# Patient Record
Sex: Female | Born: 1965 | Race: Black or African American | Hispanic: No | Smoking: Former smoker
Health system: Southern US, Community
[De-identification: ages and names within clinical notes are randomized; demographics above are authoritative.]

## PROBLEM LIST (undated history)

## (undated) DIAGNOSIS — K5900 Constipation, unspecified: Secondary | ICD-10-CM

## (undated) DIAGNOSIS — Z91018 Allergy to other foods: Secondary | ICD-10-CM

## (undated) DIAGNOSIS — M7989 Other specified soft tissue disorders: Secondary | ICD-10-CM

## (undated) DIAGNOSIS — E559 Vitamin D deficiency, unspecified: Secondary | ICD-10-CM

## (undated) DIAGNOSIS — E7800 Pure hypercholesterolemia, unspecified: Secondary | ICD-10-CM

## (undated) DIAGNOSIS — R079 Chest pain, unspecified: Secondary | ICD-10-CM

## (undated) DIAGNOSIS — E538 Deficiency of other specified B group vitamins: Secondary | ICD-10-CM

## (undated) DIAGNOSIS — R12 Heartburn: Secondary | ICD-10-CM

## (undated) DIAGNOSIS — M2550 Pain in unspecified joint: Secondary | ICD-10-CM

## (undated) DIAGNOSIS — R0602 Shortness of breath: Secondary | ICD-10-CM

## (undated) DIAGNOSIS — M549 Dorsalgia, unspecified: Secondary | ICD-10-CM

## (undated) DIAGNOSIS — E0500 Thyrotoxicosis with diffuse goiter without thyrotoxic crisis or storm: Secondary | ICD-10-CM

## (undated) DIAGNOSIS — E0590 Thyrotoxicosis, unspecified without thyrotoxic crisis or storm: Secondary | ICD-10-CM

## (undated) DIAGNOSIS — I82409 Acute embolism and thrombosis of unspecified deep veins of unspecified lower extremity: Secondary | ICD-10-CM

## (undated) HISTORY — DX: Allergy to other foods: Z91.018

## (undated) HISTORY — DX: Constipation, unspecified: K59.00

## (undated) HISTORY — DX: Other specified soft tissue disorders: M79.89

## (undated) HISTORY — DX: Thyrotoxicosis, unspecified without thyrotoxic crisis or storm: E05.90

## (undated) HISTORY — DX: Heartburn: R12

## (undated) HISTORY — DX: Vitamin D deficiency, unspecified: E55.9

## (undated) HISTORY — DX: Dorsalgia, unspecified: M54.9

## (undated) HISTORY — DX: Deficiency of other specified B group vitamins: E53.8

## (undated) HISTORY — DX: Chest pain, unspecified: R07.9

## (undated) HISTORY — PX: ABDOMINAL HYSTERECTOMY: SHX81

## (undated) HISTORY — DX: Shortness of breath: R06.02

## (undated) HISTORY — DX: Thyrotoxicosis with diffuse goiter without thyrotoxic crisis or storm: E05.00

## (undated) HISTORY — DX: Pain in unspecified joint: M25.50

---

## 1998-08-15 ENCOUNTER — Emergency Department (HOSPITAL_COMMUNITY): Admission: EM | Admit: 1998-08-15 | Discharge: 1998-08-15 | Payer: Self-pay | Admitting: Emergency Medicine

## 1998-08-15 ENCOUNTER — Encounter: Payer: Self-pay | Admitting: Emergency Medicine

## 2006-05-07 ENCOUNTER — Other Ambulatory Visit: Admission: RE | Admit: 2006-05-07 | Discharge: 2006-05-07 | Payer: Self-pay | Admitting: Obstetrics and Gynecology

## 2008-06-22 ENCOUNTER — Ambulatory Visit (HOSPITAL_COMMUNITY): Admission: RE | Admit: 2008-06-22 | Discharge: 2008-06-22 | Payer: Self-pay | Admitting: Obstetrics and Gynecology

## 2008-07-02 ENCOUNTER — Encounter (INDEPENDENT_AMBULATORY_CARE_PROVIDER_SITE_OTHER): Payer: Self-pay | Admitting: Obstetrics and Gynecology

## 2008-07-02 ENCOUNTER — Ambulatory Visit (HOSPITAL_COMMUNITY): Admission: RE | Admit: 2008-07-02 | Discharge: 2008-07-02 | Payer: Self-pay | Admitting: Obstetrics and Gynecology

## 2008-07-02 ENCOUNTER — Inpatient Hospital Stay (HOSPITAL_COMMUNITY): Admission: AD | Admit: 2008-07-02 | Discharge: 2008-07-02 | Payer: Self-pay | Admitting: Obstetrics and Gynecology

## 2008-07-02 ENCOUNTER — Ambulatory Visit: Payer: Self-pay | Admitting: Vascular Surgery

## 2008-07-04 ENCOUNTER — Ambulatory Visit: Payer: Self-pay | Admitting: Internal Medicine

## 2008-07-04 ENCOUNTER — Encounter (INDEPENDENT_AMBULATORY_CARE_PROVIDER_SITE_OTHER): Payer: Self-pay | Admitting: Obstetrics and Gynecology

## 2008-07-04 ENCOUNTER — Inpatient Hospital Stay (HOSPITAL_COMMUNITY): Admission: AD | Admit: 2008-07-04 | Discharge: 2008-07-06 | Payer: Self-pay | Admitting: Obstetrics and Gynecology

## 2008-07-09 ENCOUNTER — Ambulatory Visit: Payer: Self-pay | Admitting: Internal Medicine

## 2008-07-11 LAB — CBC WITH DIFFERENTIAL/PLATELET
BASO%: 1.2 % (ref 0.0–2.0)
Basophils Absolute: 0.1 10*3/uL (ref 0.0–0.1)
EOS%: 2.7 % (ref 0.0–7.0)
Eosinophils Absolute: 0.2 10*3/uL (ref 0.0–0.5)
HCT: 28.9 % — ABNORMAL LOW (ref 34.8–46.6)
HGB: 9 g/dL — ABNORMAL LOW (ref 11.6–15.9)
LYMPH%: 41.5 % (ref 14.0–48.0)
MCH: 21.6 pg — ABNORMAL LOW (ref 26.0–34.0)
MCHC: 31.1 g/dL — ABNORMAL LOW (ref 32.0–36.0)
MCV: 69.4 fL — ABNORMAL LOW (ref 81.0–101.0)
MONO#: 0.5 10*3/uL (ref 0.1–0.9)
MONO%: 7.5 % (ref 0.0–13.0)
NEUT#: 2.9 10*3/uL (ref 1.5–6.5)
NEUT%: 47.1 % (ref 39.6–76.8)
Platelets: 431 10*3/uL — ABNORMAL HIGH (ref 145–400)
RBC: 4.16 10*6/uL (ref 3.70–5.32)
RDW: 19 % — ABNORMAL HIGH (ref 11.3–14.5)
WBC: 6.1 10*3/uL (ref 3.9–10.0)
lymph#: 2.5 10*3/uL (ref 0.9–3.3)

## 2008-07-11 LAB — HEPARIN ANTI-XA: Heparin LMW: 1.38 [IU]/mL

## 2008-07-16 LAB — PROTIME-INR
INR: 1.2 — ABNORMAL LOW (ref 2.00–3.50)
Protime: 14.4 Seconds — ABNORMAL HIGH (ref 10.6–13.4)

## 2008-07-18 LAB — PROTIME-INR
INR: 1.4 — ABNORMAL LOW (ref 2.00–3.50)
Protime: 16.8 Seconds — ABNORMAL HIGH (ref 10.6–13.4)

## 2008-07-24 LAB — PROTIME-INR
INR: 1.9 — ABNORMAL LOW (ref 2.00–3.50)
Protime: 22.8 Seconds — ABNORMAL HIGH (ref 10.6–13.4)

## 2008-07-30 LAB — CBC WITH DIFFERENTIAL/PLATELET
BASO%: 1.1 % (ref 0.0–2.0)
Basophils Absolute: 0.1 10*3/uL (ref 0.0–0.1)
EOS%: 3.5 % (ref 0.0–7.0)
Eosinophils Absolute: 0.2 10*3/uL (ref 0.0–0.5)
HCT: 35.5 % (ref 34.8–46.6)
HGB: 11 g/dL — ABNORMAL LOW (ref 11.6–15.9)
LYMPH%: 38.9 % (ref 14.0–48.0)
MCH: 22.4 pg — ABNORMAL LOW (ref 26.0–34.0)
MCHC: 31 g/dL — ABNORMAL LOW (ref 32.0–36.0)
MCV: 72.4 fL — ABNORMAL LOW (ref 81.0–101.0)
MONO#: 0.4 10*3/uL (ref 0.1–0.9)
MONO%: 7.8 % (ref 0.0–13.0)
NEUT#: 2.8 10*3/uL (ref 1.5–6.5)
NEUT%: 48.7 % (ref 39.6–76.8)
Platelets: 239 10*3/uL (ref 145–400)
RBC: 4.91 10*6/uL (ref 3.70–5.32)
RDW: 23.4 % — ABNORMAL HIGH (ref 11.3–14.5)
WBC: 5.7 10*3/uL (ref 3.9–10.0)
lymph#: 2.2 10*3/uL (ref 0.9–3.3)

## 2008-07-30 LAB — PROTIME-INR
INR: 1.2 — ABNORMAL LOW (ref 2.00–3.50)
Protime: 14.4 Seconds — ABNORMAL HIGH (ref 10.6–13.4)

## 2008-08-02 LAB — CBC WITH DIFFERENTIAL/PLATELET
BASO%: 1.5 % (ref 0.0–2.0)
Basophils Absolute: 0.1 10*3/uL (ref 0.0–0.1)
EOS%: 3.8 % (ref 0.0–7.0)
Eosinophils Absolute: 0.2 10*3/uL (ref 0.0–0.5)
HCT: 38.1 % (ref 34.8–46.6)
HGB: 11.8 g/dL (ref 11.6–15.9)
LYMPH%: 35.9 % (ref 14.0–48.0)
MCH: 22.7 pg — ABNORMAL LOW (ref 26.0–34.0)
MCHC: 30.9 g/dL — ABNORMAL LOW (ref 32.0–36.0)
MCV: 73.4 fL — ABNORMAL LOW (ref 81.0–101.0)
MONO#: 0.3 10*3/uL (ref 0.1–0.9)
MONO%: 5.9 % (ref 0.0–13.0)
NEUT#: 2.5 10*3/uL (ref 1.5–6.5)
NEUT%: 52.9 % (ref 39.6–76.8)
Platelets: 246 10*3/uL (ref 145–400)
RBC: 5.19 10*6/uL (ref 3.70–5.32)
RDW: 23.1 % — ABNORMAL HIGH (ref 11.3–14.5)
WBC: 4.8 10*3/uL (ref 3.9–10.0)
lymph#: 1.7 10*3/uL (ref 0.9–3.3)

## 2008-08-02 LAB — PROTIME-INR
INR: 2.2 (ref 2.00–3.50)
Protime: 26.4 Seconds — ABNORMAL HIGH (ref 10.6–13.4)

## 2008-08-06 LAB — PROTIME-INR
INR: 3 (ref 2.00–3.50)
Protime: 36 Seconds — ABNORMAL HIGH (ref 10.6–13.4)

## 2008-08-15 LAB — CBC WITH DIFFERENTIAL/PLATELET
BASO%: 1.5 % (ref 0.0–2.0)
Basophils Absolute: 0.1 10*3/uL (ref 0.0–0.1)
EOS%: 2.7 % (ref 0.0–7.0)
Eosinophils Absolute: 0.2 10*3/uL (ref 0.0–0.5)
HCT: 36.6 % (ref 34.8–46.6)
HGB: 11.6 g/dL (ref 11.6–15.9)
LYMPH%: 31.4 % (ref 14.0–48.0)
MCH: 23.6 pg — ABNORMAL LOW (ref 26.0–34.0)
MCHC: 31.5 g/dL — ABNORMAL LOW (ref 32.0–36.0)
MCV: 74.7 fL — ABNORMAL LOW (ref 81.0–101.0)
MONO#: 0.5 10*3/uL (ref 0.1–0.9)
MONO%: 7.3 % (ref 0.0–13.0)
NEUT#: 3.6 10*3/uL (ref 1.5–6.5)
NEUT%: 57.1 % (ref 39.6–76.8)
Platelets: 305 10*3/uL (ref 145–400)
RBC: 4.9 10*6/uL (ref 3.70–5.32)
RDW: 22.9 % — ABNORMAL HIGH (ref 11.3–14.5)
WBC: 6.3 10*3/uL (ref 3.9–10.0)
lymph#: 2 10*3/uL (ref 0.9–3.3)

## 2008-08-15 LAB — PROTIME-INR
INR: 3 (ref 2.00–3.50)
Protime: 36 Seconds — ABNORMAL HIGH (ref 10.6–13.4)

## 2008-08-27 ENCOUNTER — Ambulatory Visit: Payer: Self-pay | Admitting: Internal Medicine

## 2008-08-27 LAB — CBC WITH DIFFERENTIAL/PLATELET
BASO%: 1.2 % (ref 0.0–2.0)
Basophils Absolute: 0.1 10*3/uL (ref 0.0–0.1)
EOS%: 3.7 % (ref 0.0–7.0)
Eosinophils Absolute: 0.2 10*3/uL (ref 0.0–0.5)
HCT: 38.1 % (ref 34.8–46.6)
HGB: 12.2 g/dL (ref 11.6–15.9)
LYMPH%: 34.7 % (ref 14.0–48.0)
MCH: 24.5 pg — ABNORMAL LOW (ref 26.0–34.0)
MCHC: 32.1 g/dL (ref 32.0–36.0)
MCV: 76.4 fL — ABNORMAL LOW (ref 81.0–101.0)
MONO#: 0.6 10*3/uL (ref 0.1–0.9)
MONO%: 11.7 % (ref 0.0–13.0)
NEUT#: 2.6 10*3/uL (ref 1.5–6.5)
NEUT%: 48.7 % (ref 39.6–76.8)
Platelets: 230 10*3/uL (ref 145–400)
RBC: 5 10*6/uL (ref 3.70–5.32)
RDW: 22.3 % — ABNORMAL HIGH (ref 11.3–14.5)
WBC: 5.4 10*3/uL (ref 3.9–10.0)
lymph#: 1.9 10*3/uL (ref 0.9–3.3)

## 2008-08-27 LAB — PROTIME-INR
INR: 2.8 (ref 2.00–3.50)
Protime: 33.6 Seconds — ABNORMAL HIGH (ref 10.6–13.4)

## 2008-09-10 LAB — CBC WITH DIFFERENTIAL/PLATELET
BASO%: 1.5 % (ref 0.0–2.0)
Basophils Absolute: 0.1 10*3/uL (ref 0.0–0.1)
EOS%: 3.1 % (ref 0.0–7.0)
Eosinophils Absolute: 0.2 10*3/uL (ref 0.0–0.5)
HCT: 36.7 % (ref 34.8–46.6)
HGB: 12 g/dL (ref 11.6–15.9)
LYMPH%: 28.6 % (ref 14.0–48.0)
MCH: 25.1 pg — ABNORMAL LOW (ref 26.0–34.0)
MCHC: 32.6 g/dL (ref 32.0–36.0)
MCV: 77 fL — ABNORMAL LOW (ref 81.0–101.0)
MONO#: 0.4 10*3/uL (ref 0.1–0.9)
MONO%: 8.7 % (ref 0.0–13.0)
NEUT#: 3 10*3/uL (ref 1.5–6.5)
NEUT%: 58.1 % (ref 39.6–76.8)
Platelets: 294 10*3/uL (ref 145–400)
RBC: 4.77 10*6/uL (ref 3.70–5.32)
RDW: 20.6 % — ABNORMAL HIGH (ref 11.3–14.5)
WBC: 5.1 10*3/uL (ref 3.9–10.0)
lymph#: 1.5 10*3/uL (ref 0.9–3.3)

## 2008-09-10 LAB — PROTIME-INR
INR: 1.4 — ABNORMAL LOW (ref 2.00–3.50)
Protime: 16.8 Seconds — ABNORMAL HIGH (ref 10.6–13.4)

## 2008-09-13 LAB — PROTIME-INR
INR: 1.8 — ABNORMAL LOW (ref 2.00–3.50)
Protime: 21.6 Seconds — ABNORMAL HIGH (ref 10.6–13.4)

## 2008-09-27 LAB — CBC WITH DIFFERENTIAL/PLATELET
BASO%: 1.7 % (ref 0.0–2.0)
Basophils Absolute: 0.1 10*3/uL (ref 0.0–0.1)
EOS%: 5.7 % (ref 0.0–7.0)
Eosinophils Absolute: 0.2 10*3/uL (ref 0.0–0.5)
HCT: 35.7 % (ref 34.8–46.6)
HGB: 11.5 g/dL — ABNORMAL LOW (ref 11.6–15.9)
LYMPH%: 33.1 % (ref 14.0–48.0)
MCH: 25.9 pg — ABNORMAL LOW (ref 26.0–34.0)
MCHC: 32.2 g/dL (ref 32.0–36.0)
MCV: 80.6 fL — ABNORMAL LOW (ref 81.0–101.0)
MONO#: 0.3 10*3/uL (ref 0.1–0.9)
MONO%: 9 % (ref 0.0–13.0)
NEUT#: 2 10*3/uL (ref 1.5–6.5)
NEUT%: 50.6 % (ref 39.6–76.8)
Platelets: 240 10*3/uL (ref 145–400)
RBC: 4.44 10*6/uL (ref 3.70–5.32)
RDW: 19.9 % — ABNORMAL HIGH (ref 11.3–14.5)
WBC: 3.9 10*3/uL (ref 3.9–10.0)
lymph#: 1.3 10*3/uL (ref 0.9–3.3)

## 2008-09-27 LAB — PROTIME-INR
INR: 1.8 — ABNORMAL LOW (ref 2.00–3.50)
Protime: 21.6 Seconds — ABNORMAL HIGH (ref 10.6–13.4)

## 2008-10-09 ENCOUNTER — Ambulatory Visit: Payer: Self-pay | Admitting: Surgery

## 2008-10-09 ENCOUNTER — Ambulatory Visit (HOSPITAL_COMMUNITY): Admission: RE | Admit: 2008-10-09 | Discharge: 2008-10-09 | Payer: Self-pay | Admitting: Obstetrics and Gynecology

## 2008-10-09 ENCOUNTER — Encounter (INDEPENDENT_AMBULATORY_CARE_PROVIDER_SITE_OTHER): Payer: Self-pay | Admitting: Obstetrics and Gynecology

## 2008-10-15 ENCOUNTER — Inpatient Hospital Stay (HOSPITAL_COMMUNITY): Admission: RE | Admit: 2008-10-15 | Discharge: 2008-10-17 | Payer: Self-pay | Admitting: Obstetrics and Gynecology

## 2008-10-15 ENCOUNTER — Encounter (INDEPENDENT_AMBULATORY_CARE_PROVIDER_SITE_OTHER): Payer: Self-pay | Admitting: Obstetrics and Gynecology

## 2008-10-19 ENCOUNTER — Ambulatory Visit: Payer: Self-pay | Admitting: Internal Medicine

## 2008-10-23 LAB — PROTIME-INR
INR: 1.5 — ABNORMAL LOW (ref 2.00–3.50)
Protime: 18 Seconds — ABNORMAL HIGH (ref 10.6–13.4)

## 2008-10-26 LAB — CBC WITH DIFFERENTIAL/PLATELET
BASO%: 1 % (ref 0.0–2.0)
Basophils Absolute: 0.1 10*3/uL (ref 0.0–0.1)
EOS%: 3.2 % (ref 0.0–7.0)
Eosinophils Absolute: 0.2 10*3/uL (ref 0.0–0.5)
HCT: 31.5 % — ABNORMAL LOW (ref 34.8–46.6)
HGB: 10.6 g/dL — ABNORMAL LOW (ref 11.6–15.9)
LYMPH%: 32.4 % (ref 14.0–48.0)
MCH: 28.2 pg (ref 26.0–34.0)
MCHC: 33.8 g/dL (ref 32.0–36.0)
MCV: 83.5 fL (ref 81.0–101.0)
MONO#: 0.4 10*3/uL (ref 0.1–0.9)
MONO%: 7.7 % (ref 0.0–13.0)
NEUT#: 3.1 10*3/uL (ref 1.5–6.5)
NEUT%: 55.7 % (ref 39.6–76.8)
Platelets: 432 10*3/uL — ABNORMAL HIGH (ref 145–400)
RBC: 3.77 10*6/uL (ref 3.70–5.32)
RDW: 16 % — ABNORMAL HIGH (ref 11.3–14.5)
WBC: 5.6 10*3/uL (ref 3.9–10.0)
lymph#: 1.8 10*3/uL (ref 0.9–3.3)

## 2008-10-26 LAB — PROTIME-INR
INR: 1.4 — ABNORMAL LOW (ref 2.00–3.50)
Protime: 16.8 Seconds — ABNORMAL HIGH (ref 10.6–13.4)

## 2008-10-30 LAB — PROTIME-INR
INR: 1.8 — ABNORMAL LOW (ref 2.00–3.50)
Protime: 21.6 Seconds — ABNORMAL HIGH (ref 10.6–13.4)

## 2008-11-02 LAB — PROTIME-INR
INR: 3.3 (ref 2.00–3.50)
Protime: 39.6 Seconds — ABNORMAL HIGH (ref 10.6–13.4)

## 2008-11-06 LAB — PROTIME-INR
INR: 2.4 (ref 2.00–3.50)
Protime: 28.8 Seconds — ABNORMAL HIGH (ref 10.6–13.4)

## 2008-11-23 LAB — PROTIME-INR
INR: 2 (ref 2.00–3.50)
Protime: 24 Seconds — ABNORMAL HIGH (ref 10.6–13.4)

## 2008-12-12 ENCOUNTER — Ambulatory Visit: Payer: Self-pay | Admitting: Internal Medicine

## 2008-12-14 LAB — CBC WITH DIFFERENTIAL/PLATELET
BASO%: 0.9 % (ref 0.0–2.0)
Basophils Absolute: 0 10*3/uL (ref 0.0–0.1)
EOS%: 5 % (ref 0.0–7.0)
Eosinophils Absolute: 0.3 10*3/uL (ref 0.0–0.5)
HCT: 37.8 % (ref 34.8–46.6)
HGB: 12.6 g/dL (ref 11.6–15.9)
LYMPH%: 26.9 % (ref 14.0–48.0)
MCH: 29.3 pg (ref 26.0–34.0)
MCHC: 33.4 g/dL (ref 32.0–36.0)
MCV: 87.9 fL (ref 81.0–101.0)
MONO#: 0.3 10*3/uL (ref 0.1–0.9)
MONO%: 5 % (ref 0.0–13.0)
NEUT#: 3.5 10*3/uL (ref 1.5–6.5)
NEUT%: 62.2 % (ref 39.6–76.8)
Platelets: 218 10*3/uL (ref 145–400)
RBC: 4.3 10*6/uL (ref 3.70–5.32)
RDW: 12.5 % (ref 11.3–14.5)
WBC: 5.6 10*3/uL (ref 3.9–10.0)
lymph#: 1.5 10*3/uL (ref 0.9–3.3)

## 2008-12-14 LAB — PROTIME-INR
INR: 1.1 — ABNORMAL LOW (ref 2.00–3.50)
Protime: 13.2 Seconds (ref 10.6–13.4)

## 2008-12-28 LAB — PROTIME-INR
INR: 1.9 — ABNORMAL LOW (ref 2.00–3.50)
Protime: 22.8 Seconds — ABNORMAL HIGH (ref 10.6–13.4)

## 2009-01-21 ENCOUNTER — Ambulatory Visit: Payer: Self-pay | Admitting: Internal Medicine

## 2009-01-23 LAB — CBC WITH DIFFERENTIAL/PLATELET
BASO%: 0.7 % (ref 0.0–2.0)
Basophils Absolute: 0 10*3/uL (ref 0.0–0.1)
EOS%: 3.1 % (ref 0.0–7.0)
Eosinophils Absolute: 0.1 10*3/uL (ref 0.0–0.5)
HCT: 40.2 % (ref 34.8–46.6)
HGB: 13.4 g/dL (ref 11.6–15.9)
LYMPH%: 37.2 % (ref 14.0–49.7)
MCH: 28 pg (ref 25.1–34.0)
MCHC: 33.3 g/dL (ref 31.5–36.0)
MCV: 84.1 fL (ref 79.5–101.0)
MONO#: 0.2 10*3/uL (ref 0.1–0.9)
MONO%: 5.3 % (ref 0.0–14.0)
NEUT#: 2.4 10*3/uL (ref 1.5–6.5)
NEUT%: 53.7 % (ref 38.4–76.8)
Platelets: 224 10*3/uL (ref 145–400)
RBC: 4.78 10*6/uL (ref 3.70–5.45)
RDW: 12 % (ref 11.2–14.5)
WBC: 4.5 10*3/uL (ref 3.9–10.3)
lymph#: 1.7 10*3/uL (ref 0.9–3.3)
nRBC: 0 % (ref 0–0)

## 2009-01-23 LAB — PROTIME-INR
INR: 2.2 (ref 2.00–3.50)
Protime: 26.4 Seconds — ABNORMAL HIGH (ref 10.6–13.4)

## 2009-07-03 ENCOUNTER — Encounter: Admission: RE | Admit: 2009-07-03 | Discharge: 2009-07-03 | Payer: Self-pay | Admitting: Obstetrics and Gynecology

## 2011-03-31 NOTE — H&P (Signed)
NAME:  Sandra Thornton, Sandra Thornton NO.:  [id number]   MEDICAL RECORD NO.:  [id number]         PATIENT TYPE:  WINP   LOCATION:                                FACILITY:  [facility]   PHYSICIAN:  Osborn Coho, M.D.   DATE OF BIRTH:  [birth year]   DATE OF ADMISSION:  DATE OF DISCHARGE:                              HISTORY & PHYSICAL   HISTORY OF PRESENT ILLNESS:  Sandra Thornton is a 45 year old single African  American female gravida 0 presenting for a total laparoscopic  hysterectomy because of symptomatic uterine fibroids, menorrhagia, and  dysmenorrhea.  Over the past year, the patient reports that her  menstrual flow has increased significantly, lasting approximately 8 days  during which time she changes a pad along with a tampon every 45  minutes.  The patient has often soiled her clothes and endures nausea  with cramps, which she rates as an 8/10 on a 10-point pain scale.  The  patient finds minimal relief with ibuprofen, which would decrease her  pain to a 5/10 on 10-point pain scale.  However, she still endures  significant lower back and pelvic cramping.  In April 2009, the patient  underwent a pelvic ultrasound, which showed a uterus measuring 7.35 x  9.85 x 7.11 cm along with four measurable uterine fibroids; anteriorly  3.47 x 2.35 x 2.93 cm; fundal 2.78 x 2.65 x 2.66 cm; posteriorly 4.05 x  3.39 x 3.59 cm, and posterior left pedunculated 4.23 x 3.97 x 4.0 cm.  The patient's endometrium was difficult to evaluate because of these  numerous fibroids.  The patient's ovaries on this day were within normal  limits.  A subsequent sonohystogram was done in May 2009, revealing  hyperechoic masses; posteriorly 1.09 x 0.56 cm and 0.96 x 0.56 cm;  anteriorly 1.51 x 0.88 cm, all of which have the appearance of polyps.  There was a fundal fibroid observed, which had a submucosal appearance  measuring 2.73 x 1.48 cm within the endometrial cavity.  In August 2009,  the patient underwent  hysteroscopy with D&C and was given an injection  of 3.75 mg of Lupron Depot.  Following this procedure, the patient  developed a left lower extremity deep vein thrombosis approximately 10  days postoperatively and was placed on anticoagulation therapy.  The  patient was also observed to be anemic at that time with a hemoglobin of  9.4, hematocrit 30.6, and advised to take a therapy.  The patient's  anticoagulation panel, which included a Factor V Leiden, protein C, and  protein S, all of were within normal range.  The patient continued, in  spite of having received a Lupron Depot injection to experience heavy  menstrual periods as previously described.  She admits to experiencing  constipation with a menstrual period but denies any urinary tract  symptoms, vaginitis symptoms, intermenstrual bleeding, or fever.  A  review of management options were given to the patient to include  uterine artery embolization and hysterectomy and the patient has decided  to proceed with definitive therapy in the form of hysterectomy.   OB HISTORY:  [place]  0.   GYN HISTORY:  Menarche at 45 years old.  The patient's last menstrual  period was on September 25, 2008.  The patient is in a same-sex  relationship.  She denies any history of sexually transmitted diseases  or abnormal Pap smears.  Her last normal Pap smear was in April 2009.   MEDICAL HISTORY:  Left lower extremity deep vein thrombosis.  The  patient has a history of a decrease TSH, though she is euthyroid.  She  also has a history of anemia.   SURGICAL HISTORY:  In 1978 benign breast biopsy, in 2009 hysteroscopy  with D&C, denies any problems with anesthesia or history of blood  transfusion.   Family history is positive for asthma, emphysema, thyroid disease, renal  cancer, hypertension, and diabetes mellitus.   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  She is single and lives with her partner.   HABIT:   She rarely consumes tobacco (a cigar every several months or  so), alcohol socially, and denies any use of illicit drugs.   MEDICAL HISTORY:  1. Iron 1 tablet daily.  2. Coumadin 75 mg daily.  3. Lovenox 75 mg subcu as needed (the patient has been advised to      discontinue her Coumadin on October 10, 2008 and to resume Lovenox      75 mg subcutaneously twice daily through the morning of October 14, 2008).   The patient is allergic to SULFA, which causes her to swell all over but  denies any allergy to LATEX or SEAFOOD.   REVIEW OF SYSTEMS:  The patient does have occasional symptoms of acid  reflux but denies any chest pain, shortness of breath, headache, vision  changes, nausea, vomiting, diarrhea, and except as he has mentioned in  history of present illness.  The patient's review of systems is  negative.   PHYSICAL EXAMINATION:  Blood pressure is 90/70, weight is 161, height is  5 feet 1 inch tall.  General exam, heart is regular rate and rhythm.  Lungs are clear.  Pelvic exam, EGBUS is within normal limits.  The  vagina is normal.  Cervix is nontender with.  No lesions.  Uterus  approximately 14-16 weeks size without tenderness.  Adnexa without  tenderness or masses.   IMPRESSION:  1. Large symptomatic uterine fibroids.  2. Menorrhagia.  3. Dysmenorrhea.  4. Anemia.  5. Left lower extremity deep vein thrombosis.   DISPOSITION:  A discussion was held with the patient regarding the  indications for her procedure along with its risks, which include but  are not limited to reaction to anesthesia, damage to adjacent organs,  infection, excessive bleeding, the possibility of having to use an open  abdominal incision to complete her surgery, and the possibility of early  ovarian failure.  The patient verbalized understanding of these risks  and wishes to proceed with a total laparoscopic hysterectomy with the  possibility of a laparoscopically-assisted vaginal  hysterectomy and the  possibility of a total abdominal hysterectomy at [facility] on October 15, 2008 at 12:30 p.m.     Elmira J. Adline Peals.      Osborn Coho, M.D.  Electronically Signed   EJP/MEDQ  D:  10/08/2008  T:  10/09/2008  Job:  [id number]

## 2011-03-31 NOTE — Discharge Summary (Signed)
NAMEAVIKA, Sandra Thornton               ACCOUNT NO.:  [id number]   MEDICAL RECORD NO.:  [id number]          PATIENT TYPE:  INP   LOCATION:  9302                          [facility]ACILITY:  [facility]   PHYSICIAN:  Janine Limbo, M.D.DATE OF BIRTH:  [birth year]   DATE OF ADMISSION:  07/04/2008  DATE OF DISCHARGE:  07/06/2008                               DISCHARGE SUMMARY   ADMISSION DIAGNOSES:  1. Left leg deep venous thrombosis with propagation to the popliteal      area.  2. Iron deficiency anemia.  3. Menorrhagia.  4. Status post hysteroscopy on June 22, 2008.   POSTOPERATIVE DIAGNOSES:  1. Left leg deep venous thrombosis with propagation to the popliteal      area.  2. Iron deficiency anemia.  3. Menorrhagia.  4. Status post hysteroscopy on June 22, 2008.   PROCEDURE:  No procedures this admission.   HISTORY OF PRESENT ILLNESS:  Sandra Thornton is a 45 year old female who had  a hysteroscopy on June 22, 2008.  She also received Depo-Lupron for  management of menorrhagia.  The patient then presented with a left leg  deep venous thrombosis that was treated with subcutaneous Lovenox.  The  patient reports that her left leg began to swell and began to hurt.  She  presented for evaluation and she was found to have propagation of that  deep venous thrombosis to the left popliteal fossa.  Please see her  dictated history and physical exam for details.   ADMISSION EXAM:  The patient was noted to have a swollen and tender left  lower leg.   ADMISSION LABORATORY VALUES:  White blood cell count was 5500,  hemoglobin was 8.4, hematocrit was 27.1%, and platelet count was  321,000.  PT was 13.3, INR was 1, and PTT is 35.  Her chemistries were  within normal limits.   HOSPITAL COURSE:  The patient was admitted and started on IV heparin.  She quickly noted that her left leg began to feel better.  The swelling  decreased.  The patient was found to have a folate level of 11.1.  Her  iron measured at  33 mcg/dL.  Her saturation was 10%.  Her ferritin was 3  ng/mL.  The patient was found to have a therapeutic heparin level by  heparin day #2.  By the afternoon of July 06, 2008, the patient  reports that her pain was much improved.  Both of her legs measured the  same, which indicated that her swelling had completely resolved.  The  patient was ready for discharge.  Her heparin was stopped and she did  receive a dose of Lovenox 75 mg subcutaneously 1 hour after stopping her  heparin.  The patient's heparin level was 0.6 on post heparin day #2.   DISCHARGE INSTRUCTIONS:  The patient will return to see Dr. Osborn Coho in 1 week.  She will also return to see Dr. Si Gaul  early of next week.  She will increase her activity slowly.  She will  refrain from lifting for 1-2 weeks.  She will call for questions or  concerns.  She will call for any swelling or pain in the left leg.   DISCHARGE MEDICATIONS:  Lovenox 75 mg subcu twice each day.  She can use  Tylenol as needed for pain.  The patient has a prescription at home  already for Vicodin to be used for severe pain.      Janine Limbo, M.D.  Electronically Signed     AVS/MEDQ  D:  07/06/2008  T:  07/07/2008  Job:  [id number]   cc:   Lajuana Matte, MD  Fax: [phone]   Osborn Coho, M.D.  Fax: [phone]

## 2011-03-31 NOTE — Discharge Summary (Signed)
NAMEALLYNA, PITTSLEY NO.:  [id number]   MEDICAL RECORD NO.:  [id number]          PATIENT TYPE:  INP   LOCATION:  9318                          [facility]ILITY:  [facility]   PHYSICIAN:  Osborn Coho, M.D.   DATE OF BIRTH:  Apr 27, 1966   DATE OF ADMISSION:  10/15/2008  DATE OF DISCHARGE:  10/17/2008                               DISCHARGE SUMMARY   DISCHARGE DIAGNOSES:  1. Symptomatic uterine fibroids.  2. Menorrhagia.  3. Dysmenorrhea.  4. Anemia.  5. Left lower extremity deep vein thrombosis.   OPERATION:  On the day of admission, the patient underwent an attempted  total laparoscopic hysterectomy followed by a total abdominal  hysterectomy with partial left salpingectomy and cystoscopy tolerating  all procedures well.   Ms. Huard is a 45 year old single African American female gravida 0,  who presents for hysterectomy because of symptomatic uterine fibroids,  menorrhagia, and dysmenorrhea.  Please see the patient's dictated  history and physical examination for details.   PREOPERATIVE PHYSICAL EXAMINATION:  VITAL SIGNS:  Blood pressure 190/70,  weight 161, height 5 feet 1 inch tall.  GENERAL:  Within normal limits.  PELVIC:  EGBUS was within normal limits.  Vagina was normal.  Cervix was  nontender without lesions.  Uterus appeared 14-16 weeks' size without  any tenderness.  Adnexa was without tenderness or masses.   HOSPITAL COURSE:  On the date of admission, the patient underwent  aforementioned procedures tolerating them all well.  The patient was  found to have a multiple fibroid uterus, which was enlarged to  approximately 14 weeks' size.  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  The  patient's postoperative course was unremarkable with patient resuming  her anticoagulation therapy postoperatively and tolerating a postop  hemoglobin of 10.4 (preop hemoglobin 12.5).  By postop day #2, the  patient had resumed bowel and bladder function and was therefore deemed  ready for discharge to home.   DISCHARGE LABORATORY DATA:  CBC; white blood count 7.8, hemoglobin 10.4,  hematocrit 31.4, platelets 229.  PT 14.9, PTT 37, INR 1.1.  The  patient's basic metabolic panel was essentially within normal limits.   DISCHARGE MEDICATIONS:  1. Coumadin 7.5 mg daily Tuesday through Sunday, 10 mg on Monday.  2. Lovenox 75 mg subcutaneously daily.  3. Percocet 5/325 one to two every 4 hours as needed for pain.  4. Colace 100 mg twice daily until her bowel movements are regular.  5. The patient was advised to resume her iron, but to take it twice      daily for 6 weeks.   FOLLOWUP:  The patient has a 6 weeks postoperative visit with Dr.  Su Hilt on November 26, 2008, at 11:30 a.m.  She has a followup visit  with Dr. Shirline Frees, her hematologist on October 23, 2008, at 1:45 p.m.   DISCHARGE INSTRUCTIONS:  The patient was given a copy of [facility] postoperative instruction sheet.  She was further  advised to avoid driving for 2 weeks, heavy  lifting for 6 weeks,  intercourse for 6 weeks, but she may shower.  She may walk up steps.  She is to increase her activities slowly.  The patient's wound care:  She was advised to keep her incision clean and dry.  Diet was without  restriction.   FINAL PATHOLOGY:  Uterus and cervix, left fallopian tube.  Excision:  Leiomyomata.  Cervix - benign squamous mucosa and endocervical mucosa.  No dysplasia or malignancy.  Endometrium - benign proliferative  endometrium, no hyperplasia or malignancy.  Fallopian tube - no  pathologic abnormalities.      Elmira J. Adline Peals.      Osborn Coho, M.D.  Electronically Signed    EJP/MEDQ  D:  10/31/2008  T:  10/31/2008  Job:  [id number]

## 2011-03-31 NOTE — Consult Note (Signed)
Sandra Thornton, Sandra Thornton NO.:  [id number]   MEDICAL RECORD NO.:  [id number]          PATIENT TYPE:  OUT   LOCATION:  VASC                         FACILITY:  [facility]   PHYSICIAN:  Lajuana Matte, MD  DATE OF BIRTH:  12/21/1965   DATE OF CONSULTATION:  07/04/2008  DATE OF DISCHARGE:                                 CONSULTATION   REFERRING PHYSICIAN:  Osborn Coho, M.D.   REASON FOR THE CONSULT:  [age]-year-old African American female with  lower extremity deep venous thrombosis.   HISTORY:  Sandra Thornton is a very pleasant 45 year old African American  female with no significant past medical history except for history of  uterine fibroids, menorrhagia, dysmenorrhea as well as iron deficiency  anemia.  The patient had diagnostic hysteroscopy performed on June 22, 2008 for evaluation of her uterine fibroid and an attempt for resection.  The fibroids were found to be too large and too many for resection.  Dr.  Su Hilt recommended for the patient hysterectomy and she started her on  injection with Depo-Lupron, which was given on June 22, 2008.  A few  days later on August 1, 32009, the patient started having soreness and  pain in her left calf.  It was getting worse over the weekend.  She saw  Dr. Su Hilt on July 02, 2008, who ordered Dopplers of the lower  extremities, which were performed at [facility].  It revealed  deep venous thrombosis of the left gastrocnemius.  The patient was  started on Lovenox 75 mg subcutaneously twice daily.  Yesterday she  started feeling increasing swelling of her left lower extremity.  The  patient contacted Dr. Su Hilt who recommended repeating the Doppler of  the lower extremities and this was performed today, and showed  propagation of the clot to the popliteal vein, but the remaining vessels  were patent.  Dr. Su Hilt kindly asked me to see the patient today for  recommendation regarding management of her deep  venous thrombosis.   When seen today, [name] is feeling fine.  She denied having any  specific complaints.  She denied having any chest pain or shortness of  breath, denied having any cough or hemoptysis.  Of note, that the  patient had a hypercoagulable panel performed on July 02, 2008, and it  showed no significant abnormality except for lower level of functional  protein S, which is expected with her acute onset of the thrombosis.   REVIEW OF SYSTEMS:  Today she has no fever or chills.  No headache, no  blurry vision or double vision.  She has no chest pain, no shortness of  breath, no cough, no hemoptysis, syncope or palpitation.  She has no  nausea, vomiting, no abdominal pain or diarrhea, constipation, melena,  hematochezia.  No dysuria or hematuria.   PAST MEDICAL HISTORY:  As mentioned above, significant for:  1. History of uterine fibroids.  2. Iron deficiency anemia.  3. History of heart murmur.  4. Status post removal of benign cyst from her breast.  The patient denies having any history of diabetes mellitus,  hypertension, coronary artery disease or stroke.   FAMILY HISTORY:  No history of deep venous thrombosis or pulmonary  emboli.  Her mother had a history of kidney cancer.  Father is healthy.   SOCIAL HISTORY:  She is single, has no children.  She works as a  [employer] operational officer.  The patient has a history of occasionally  smoking, also history of occasional alcohol.  No history of drug abuse.   ALLERGIES:  No known drug allergies.   CURRENT HOME MEDICATIONS:  Include Lovenox and Vicodin.   PHYSICAL EXAMINATION:  Blood pressure 119/66, pulse 48, respiratory rate  20, temperature 99.0.  GENERAL:  Exam showed very pleasant 45 year old [place] American female  awake, alert, in no acute distress.  HEENT:  Normocephalic, atraumatic.  Clear oropharynx.  NECK:  Supple with no lymphadenopathy.  CHEST:  Exam clear to auscultation.  No wheezes or crackles.   CARDIOVASCULAR:  Normal S1, S2.  No murmur or gallop.  ABDOMINAL EXAM:  Soft, nontender, nondistended.  No masses.  EXTREMITIES:  Exam shows no edema in the right leg and less than 1+  edema in the left leg.   LABORATORY DATA:  CBC and BMET today showed white blood count 5.5,  hemoglobin 8.4, hematocrit 27.1, platelets 321.  Sodium 138, potassium  3.7, BUN 12, creatinine 0.72, glucose 98, calcium 9.0.   ASSESSMENT/PLAN:  This is a very pleasant 45 year old African American  female recently diagnosed with deep venous thrombosis of the left lower  extremity that showed some propagation after starting the treatment with  Lovenox.   RECOMMENDATIONS:  1. For the left lower extremity deep venous thrombosis, I recommend      admitting the patient to the [facility] and starting her on      IV heparin infusion per the pharmacy protocol for at least 2 days      until she has stabilization of the clot.  Then I would tend to      switch the patient back to Lovenox and treat her for a period      ranging between 3 to 6 months.  I would recommend ordering anti-XA      level to adjust the dose of her Lovenox per pharmacy before her      discharge.  If the patient develops any new clots while she is on      anticoagulation, she would require inferior vena cava filter      placement.  I also would not recommend giving the patient any      further doses of Depo--Lupron or any other hormonal product at this      point.  2. For the iron deficiency anemia, this is secondary to blood loss      from her menorrhagia.  I recommend starting the patient on oral      iron tablets for now.  I may consider the patient for IV iron      infusion on an outpatient basis at the [facility].      Please schedule followup appointment for this patient with me at      the [facility] within 7-10 days of her discharge.   Thank you so much for allowing me to participate in the care of Ms.  Thornton.   I will continue to follow up the patient with you and assist  in her management.      Lajuana Matte, MD  Electronically Signed     MKM/MEDQ  D:  07/04/2008  T:  07/04/2008  Job:  [id number]   cc:   Osborn Coho, M.D.  Fax: [phone]   Janine Limbo, M.D.  Fax: [phone]

## 2011-03-31 NOTE — Op Note (Signed)
NAMESALENE, MOHAMUD NO.:  [id number]   MEDICAL RECORD NO.:  [id number]         PATIENT TYPE:  WINP   LOCATION:                                FACILITY:  [facility]   PHYSICIAN:  Osborn Coho, M.D.   DATE OF BIRTH:  08/19/1966   DATE OF PROCEDURE:  10/15/2008  DATE OF DISCHARGE:  10/17/2008                               OPERATIVE REPORT   PREOPERATIVE DIAGNOSES:  1. Symptomatic uterine fibroids.  2. Menorrhagia.  3. Dysmenorrhea.  4. Anemia.  5. Status post left lower extremity deep venous thrombosis.   POSTOPERATIVE DIAGNOSES:  1. Symptomatic uterine fibroids.  2. Menorrhagia.  3. Dysmenorrhea.  4. Anemia.  5. Status post left lower extremity deep venous thrombosis.   PROCEDURES:  1. Laparoscopy secondary to attempted TLH.  2. Total abdominal hysterectomy.  3. Partial left salpingectomy.  4. Cystoscopy.   ATTENDING DOCTOR:  Osborn Coho, MD   ASSISTANT:  Marquis Lunch. Adline Peals.   ANESTHESIA:  General.   FINDINGS:  Multiple uterine fibroids enlarged to approximately 14 weeks'  size and bulky.  Patent ureters with two ureters noted on the left and  one on the right at the time of cystoscopy.   SPECIMEN:  Sent to Pathology.   FLUIDS:  2800 mL.   URINE OUTPUT:  500 mL.   ESTIMATED BLOOD LOSS:  300 mL.   COMPLICATIONS:  None.   PROCEDURE:  The patient was taken to the operating room after the risks,  benefits, and alternatives discussed with the patient.  The patient  verbalized understanding and the consent signed and witnessed.  The  risks included, but were not limited to, bleeding, infection, injury,  and possible laparotomy.  The patient was placed under general  anesthesia and prepped and draped in the normal sterile fashion in the  dorsal lithotomy position.  A weighted speculum was placed in the  patient's vagina and Deaver retractor was placed for vaginal wall  retraction.  The anterior lip of the cervix was grasped with single-  tooth tenaculum and the uterus sounded to approximately 11 cm.  The size  10 probe was used and size 3.5 cm coring was used as well for the  intrauterine manipulator.  The intrauterine balloon was insufflated with  saline with approximately 5 mL of normal saline.  The pneumo occluder  was inflated with approximately 60 mL of normal saline.  After gowning  and re-gloving, attention was then returned to the abdomen where a 10-mm  incision was made at the umbilicus and the Veress needle introduced into  the intra-abdominal cavity and pneumoperitoneum achieved.  The 10-mm  trocar was then advanced into the intra-abdominal cavity and laparoscope  introduced.  Large bulky uterus was noted.  A 10-mm incision was made in  the suprapubic region and trocar advanced under direct visualization.  An attempt to discern how mobile the uterus was via manipulation through  this port was identified.  There was minimal mobilization and decision  was made to convert to total abdominal hysterectomy.  The fascia was  repaired at the umbilicus using 0 Vicryl via  a running stitch and the  skin was repaired with 3-0 Monocryl via a subcuticular stitch.  This was  done after removing the trocar under direct visualization and the  laparoscope.  A Pfannenstiel skin incision was then made at the level of  the suprapubic incision and carried down to the underlying layer of  fascia.  The fascia was excised bilaterally in the midline and extended  bilaterally with the Mayo scissors.  Kocher clamps were placed on the  inferior aspect of the fascial incision and the rectus muscle excised  from the fascia.  The same was done on the superior aspect of the  fascial incision.  The muscle separated in the midline and the  peritoneum entered bluntly and extended manually.  The uterus was  elevated and Deaver retractor was used to help with visualization.  The  round ligament was identified on the right and was clamped, excised,  and  suture ligated using 0 Vicryl.  The same was done on the left.  The  bladder flap was created.  The utero-ovarian ligament was then isolated  on the patient's left and doubly clamped, excised, ligated with 0 Vicryl  ties and suture ligated with 0 Vicryl as well.  The same was done on the  contralateral side.  The uterine vessels were bilaterally skeletonized,  clamped with a curved Heaney, cut and suture ligated using 0 Vicryl.  A  straight Heaney clamp was used to then clamp the paracervical tissue  bilaterally, which was then cut and suture ligated using 0 Vicryl.  The  uterine fundus was then amputated and sent to Pathology.  The remainder  of the paracervical tissue was then clamped bilaterally with straight  Heaney clamps, cut and suture ligated using 0 Vicryl.  This was done  incorporating the cardinal and uterosacral ligaments down to the level  of the external os.  The lower edge of the cervix and vagina was then  clamped with a curved Heaney bilaterally excised and suture ligated  using 0 Vicryl fixation stitches at the angles.  The cervical stump was  then sent off to Pathology, the external portion visualized, and had the  shiny appearance of the external os.  The vaginal cuff was repaired with  figure-of-eight stitches of 0 Vicryl.  There was good hemostasis noted  at the vaginal cuff except on the anterior cervix where a slight oozing.  Surgicel was placed to this area.  Attention was then turned to the  bilateral adnexa.  The right adnexa was noted to be hemostatic and the  left adnexa was noted to have some bleeding.  The fallopian tube on that  side was friable and bleeding and decision was made to perform partial  salpingectomy on the patient's left.  A Kelly was placed.  The fallopian  tube was then cut and ligated with 0 Vicryl.  There was good hemostasis  at that time that was noted.  The intra-abdominal cavity was copiously  irrigated.  Prior to placing the  Surgicel, there was as previously  stated minimal bleeding noted on the cuff with very small amount of  oozing.  Secondary due to being close to the ureter, Surgicel was  applied to this area.  Malleable retractors had been placed in the  posterior aspect of the cervix in order to help protect the bowel and  two laparotomy sponges had been placed early in the procedure as well.  All instruments were removed.  The peritoneum was repaired with 2-0  chromic via a running stitch.  The fascia was repaired via 0 Vicryl via  a running stitch.  The subcutaneous tissue was irrigated and made  hemostatic with the Bovie, and the subcutaneous tissue was  reapproximated using 3 interrupted stitches of 2-0 plain.  The skin was  reapproximated using 3-0 Monocryl via a subcuticular stitch.  Prior to  amputation of the uterus, the surgical tech deflated the balloons on the  HUMI intrauterine manipulator and it was removed from the vagina without  difficulty and all instruments were noted.  Betadine was then applied to  the urethra and cystoscopy performed.  Indigo carmine was administered  and double ureters were noted on the patient's left and both were noted  to be patent with good efflux of  the indigo carmine and the ureter was identified on the right as well  and noted to be patent with efflux of the indigo carmine.  Sponge, lap,  and needle count was correct.  The patient tolerated procedure well and  was returned to the recovery room in good condition.      Osborn Coho, M.D.  Electronically Signed     AR/MEDQ  D:  11/05/2008  T:  11/05/2008  Job:  [id number]

## 2011-03-31 NOTE — Op Note (Signed)
[name], Sandra Thornton               ACCOUNT NO.:  [id number]   MEDICAL RECORD NO.:  [id number]          PATIENT TYPE:  AMB   LOCATION:  [facility]                           FACILITY:  [facility]   PHYSICIAN:  Osborn Coho, M.D.   DATE OF BIRTH:  1966/09/10   DATE OF PROCEDURE:  06/22/2008  DATE OF DISCHARGE:                               OPERATIVE REPORT   PREOPERATIVE DIAGNOSES:  1. Menorrhagia  2. Dysmenorrhea.  3. Fibroids.  4. Polyps.   POSTOPERATIVE DIAGNOSIS:  1. Menorrhagia  2. Dysmenorrhea.  3. Fibroids.  4. Polyps.   PROCEDURE:  Diagnostic hysteroscopy.   ATTENDING:  Osborn Coho, MD   ANESTHESIA:  General via LMA.   FINDINGS:  Two large submucosal fibroids and uterus sounds to 12.5 cm.   FLUIDS:  500 mL.   URINE OUTPUT:  Quantity sufficient via straight cath prior to procedure.   ESTIMATED BLOOD LOSS:  Minimal.   HYSTEROSCOPIC FLUID DEFICIT:  245.   COMPLICATIONS:  None.   PROCEDURE:  The patient was taken to the operating room after the risks,  benefits, and alternatives discussed with the patient.  The patient  verbalized understanding and consent signed and witnessed.  The patient  was placed under general-epidural anesthesia and prepped and draped in  normal sterile fashion in the dorsal lithotomy position.  A bivalve  speculum was placed in the patient's vagina and the anterior lip of the  cervix was grasped with a single-tooth tenaculum.  A paracervical block  was administered using a total of 10 mL of 1% lidocaine.  The cervical  length measured approximately 4 cm and the uterus sounded to 12.5 cm.  The cervix was dilated for passage of the diagnostic hysteroscope.  Diagnostic hysteroscope was introduced and submucosal fibroid noticed.  The cervix was then dilated for passage of the resectoscope.  The  resectoscope was introduced and an area in the cervical canal was  resected in order to place a new resectoscope into the uterine cavity.  The resectoscope  was placed into the uterine cavity and 2 large  submucosal fibroids were noted that encompassed the entire cavity.  Decision was made not to perform resection or ablation and to discuss  alternatives with the patient.  The tenaculum had pulled through on the  anterior lip of the cervix while attempting to dilate for passage of the  resectoscope.  This area was stitched at the end of the case with 0  Vicryl via a running stitch.  Good hemostasis was noted.  Count was  correct.  The patient tolerated procedure well and is currently being  transferred to the recovery room in good condition.      Osborn Coho, M.D.  Electronically Signed     AR/MEDQ  D:  06/22/2008  T:  06/23/2008  Job:  [id number]

## 2011-03-31 NOTE — H&P (Signed)
Sandra Thornton, CRANMORE NO.:  [id number]   MEDICAL RECORD NO.:  [id number]          PATIENT TYPE:  MAT   LOCATION:  [facility]                          FACILITY:  [facility]   PHYSICIAN:  Janine Limbo, M.D.DATE OF BIRTH:  11/30/1965   DATE OF ADMISSION:  07/04/2008  DATE OF DISCHARGE:                              HISTORY & PHYSICAL   [name] is a 45 year old black female old black female, nulligravida and is post  diagnostic hysteroscopy on June 22, 2008, who presents today with  worsening left lower extremity pain as well as increase in lower  extremity swelling.  She was diagnosed with a left leg DVT on Monday the  17th.  She was seen in our office and had complained of some worsening  pain which began on August 13.  She had even begun walking with a limp  and had noticed the increase in the swelling.  She did not have any  warmth or redness around their.  She reports her pain is about a 6-7/10.  Lower extremity Doppler at [facility] on Monday showed acute DVT in  gastrocnemius muscle vein noted mid calf and up distally.  Today she had  called in reporting an increase in her pain as well as an increase in  her swelling and had a repeat lower extremity Doppler which showed  evidence of continuing DVT in the gastrocnemius vein and calf which has  propagated to the popliteal vein.  However, remaining vessels are  patent.  No superficial thrombosis or Baker's cyst.  The patient did  receive a Depo-Lupron injection times one in PACU prior to her discharge  after her hysteroscopy on June 22, 2008.  She is without any other  complaints today.   ALLERGIES:  She reports a SULFA allergy causing facial swelling as well  as itching.  She has numerous environmental allergies, most significant  is CAT HAIR.  She also has several what she considers food allergies.  She mainly reports that any CITRUS FOODS or FRESH VEGETABLES cause her  to have acid bumps and upset stomach.   MEDICATIONS:  When  she was seen on Monday she was started on 75 mg of  Lovenox subcu b.i.d. and that is all.  She had previously been on  ibuprofen status post her hysteroscopy which we stopped on Monday.   PAST MEDICAL HISTORY:  She does report history of abnormal Pap smear,  anemia and a heart murmur and some mild arthritis which she attributes  to playing sports throughout her life as well as most recently has been  involved in female football team.  Menorrhagia as well as dysmenorrhea.  She has also had a history of fibroids as well as polyps.  Overactive  thyroid.   PAST SURGICAL HISTORY:  Her surgical history is remarkable for the  hysteroscopy on August 7.  Also she has had one wisdom tooth extracted.  On her hysteroscopy on the 7th she was diagnosed with two large  submucosal fibroids.   REVIEW OF SYSTEMS:  Is within normal limits.  Of significance she denies  any shortness of breath, cough,  dyspnea or chest pain.   SOCIAL HISTORY:  The patient is a Engineer, site.  She reports  occasional social alcoholic ingestion.  No illegal drug use.  No  tobacco.   FAMILY HISTORY:  Remarkable for mother with renal cancer.  She has had  one kidney that was removed.  Also reports that her mother has had  hysterectomy secondary to fibroids and endometriosis.  She has some  maternal aunts, several of them that had some fertility problems related  to the endometriosis.  Her mother also has some kind of lung disorder  that requires her to sleep with oxygen.  Maternal grandmother has  hypertension and is on medication.  She does report that both her mother  and maternal grandmother did have postop DVTs which they recall being on  pills for medication.  Maternal grandfather similar lung disease to what  her mother has.  He died of an MI.  Brother with asthma and allergies  and another brother who had rheumatic fever.   PHYSICAL EXAMINATION:  VITAL SIGNS:  The patient's vital signs today in  maternity admission  unit, blood pressure was 119/66, heart rate was 48,  temperature was 99 degrees and respirations were 20.   LABS:  These were drawn on the 17th Monday, her PT was 13.3, PTT equal  to 28.  INR was 1.  CBC - white count was 4.8, hemoglobin was 9.4,  hematocrit was 30.6 and platelets were 303.  She did have a hypercoag  panel that was drawn.  Antithrombin III was 97.  Protein C function was  98.  Protein S function was 46 which was slightly low.  Protein S was  101.  PTT LE was 38.5.  Drvt was 38.8.  Homocysteine was 5.1.  All of  these have been within normal limits.  Lupus anticoagulant was negative.  She was also negative for prothrombin II gene mutation as well as  negative for factor V Leiden.   PHYSICAL EXAM:  GENERAL:  No acute distress.  She is alert and oriented  x3 and very pleasant.  HEENT:  Grossly intact and within normal limits.  She has on glasses  today but reports that she wears both contacts and glasses.  CARDIOVASCULAR:  Regular rate and rhythm.  However, she does have a  heart murmur.  LUNGS:  Clear to auscultation bilaterally.  ABDOMEN:  Soft and nontender.  PELVIC:  Exam was deferred.  She has reported some off and on spotting  status post her hysteroscopy and the Lupron.  EXTREMITIES:  She has some edema in her left lower extremity mid calf  and up and it is definitely tender on palpation.  There is no warmth or  redness and in comparison the right lower extremity is without any of  those.   IMPRESSION:  1. Acute lower extremity DVT (deep venous thrombosis) with worsening      swelling and propagation of clot from gastrocnemius vein to      popliteal vein.  2. She is status post diagnostic hysteroscopy and Depo-Lupron on      June 22, 2008.  3. Anemia.  4. Normal hypercoag panel.  5. She is status post Lovenox initiation on July 02, 2008.   PLAN:  1. Dr. Stefano Gaul was at bedside for assessment of the patient as well      as discussion of plan and has  consulted with Dr. Lajuana Matte      who also will be coming by  to see the patient.  Plan is for      inpatient admission for heparin therapy.  She is to have pharmacy      to dose the heparin.  She is also to have TED hose knee highs.  She      is going to have some additional labs drawn PT/PTT, CBC and a BMP.      She is going to have an IV with D5 LR at 125 per hour, vital signs      q.8 hours, regular diet and as far as I know activity as tolerated      and MD is to follow.      Candice Denny Levy, CNM      ______________________________  Janine Limbo, M.D.    CHS/MEDQ  D:  07/04/2008  T:  07/04/2008  Job:  [id number]

## 2011-08-14 LAB — HCG, SERUM, QUALITATIVE: Preg, Serum: NEGATIVE

## 2011-08-14 LAB — CBC
HCT: 30.6 — ABNORMAL LOW
Hemoglobin: 9.4 — ABNORMAL LOW
MCHC: 30.8
MCV: 69.3 — ABNORMAL LOW
Platelets: 303
RBC: 4.42
RDW: 18.2 — ABNORMAL HIGH
WBC: 4.8

## 2011-08-18 LAB — CBC
HCT: 32.3 % — ABNORMAL LOW (ref 36.0–46.0)
HCT: 35.8 % — ABNORMAL LOW (ref 36.0–46.0)
HCT: 38.3 % (ref 36.0–46.0)
Hemoglobin: 10.6 g/dL — ABNORMAL LOW (ref 12.0–15.0)
Hemoglobin: 11.6 g/dL — ABNORMAL LOW (ref 12.0–15.0)
Hemoglobin: 12.5 g/dL (ref 12.0–15.0)
MCHC: 32.4 g/dL (ref 30.0–36.0)
MCHC: 32.7 g/dL (ref 30.0–36.0)
MCHC: 33 g/dL (ref 30.0–36.0)
MCV: 84.6 fL (ref 78.0–100.0)
MCV: 85.6 fL (ref 78.0–100.0)
MCV: 86.6 fL (ref 78.0–100.0)
Platelets: 220 10*3/uL (ref 150–400)
Platelets: 250 10*3/uL (ref 150–400)
Platelets: 284 10*3/uL (ref 150–400)
RBC: 3.73 MIL/uL — ABNORMAL LOW (ref 3.87–5.11)
RBC: 4.18 MIL/uL (ref 3.87–5.11)
RBC: 4.53 MIL/uL (ref 3.87–5.11)
RDW: 16 % — ABNORMAL HIGH (ref 11.5–15.5)
RDW: 17 % — ABNORMAL HIGH (ref 11.5–15.5)
RDW: 19.5 % — ABNORMAL HIGH (ref 11.5–15.5)
WBC: 14.4 10*3/uL — ABNORMAL HIGH (ref 4.0–10.5)
WBC: 16.4 10*3/uL — ABNORMAL HIGH (ref 4.0–10.5)
WBC: 6 10*3/uL (ref 4.0–10.5)

## 2011-08-18 LAB — PROTIME-INR
INR: 1 (ref 0.00–1.49)
Prothrombin Time: 13.1 seconds (ref 11.6–15.2)

## 2011-08-18 LAB — HCG, SERUM, QUALITATIVE: Preg, Serum: NEGATIVE

## 2011-08-18 LAB — APTT: aPTT: 25 seconds (ref 24–37)

## 2011-08-21 LAB — CBC
HCT: 31.4 % — ABNORMAL LOW (ref 36.0–46.0)
HCT: 31.7 % — ABNORMAL LOW (ref 36.0–46.0)
Hemoglobin: 10.4 g/dL — ABNORMAL LOW (ref 12.0–15.0)
Hemoglobin: 10.5 g/dL — ABNORMAL LOW (ref 12.0–15.0)
MCHC: 32.9 g/dL (ref 30.0–36.0)
MCHC: 33.1 g/dL (ref 30.0–36.0)
MCV: 86.5 fL (ref 78.0–100.0)
MCV: 86.6 fL (ref 78.0–100.0)
Platelets: 222 10*3/uL (ref 150–400)
Platelets: 229 10*3/uL (ref 150–400)
RBC: 3.63 MIL/uL — ABNORMAL LOW (ref 3.87–5.11)
RBC: 3.66 MIL/uL — ABNORMAL LOW (ref 3.87–5.11)
RDW: 16.5 % — ABNORMAL HIGH (ref 11.5–15.5)
RDW: 17.4 % — ABNORMAL HIGH (ref 11.5–15.5)
WBC: 12.8 10*3/uL — ABNORMAL HIGH (ref 4.0–10.5)
WBC: 7.8 10*3/uL (ref 4.0–10.5)

## 2011-08-21 LAB — BASIC METABOLIC PANEL
BUN: 4 mg/dL — ABNORMAL LOW (ref 6–23)
CO2: 29 mEq/L (ref 19–32)
Calcium: 8.1 mg/dL — ABNORMAL LOW (ref 8.4–10.5)
Chloride: 107 mEq/L (ref 96–112)
Creatinine, Ser: 0.77 mg/dL (ref 0.4–1.2)
GFR calc Af Amer: >60 mL/min (ref >60)
GFR calc non Af Amer: >60 mL/min (ref >60)
Glucose, Bld: 168 mg/dL — ABNORMAL HIGH (ref 70–99)
Potassium: 4.1 mEq/L (ref 3.5–5.1)
Sodium: 137 mEq/L (ref 135–145)

## 2011-08-21 LAB — DIFFERENTIAL
Basophils Absolute: 0 10*3/uL (ref 0.0–0.1)
Basophils Relative: 0 % (ref 0–1)
Eosinophils Absolute: 0.1 10*3/uL (ref 0.0–0.7)
Eosinophils Relative: 2 % (ref 0–5)
Lymphocytes Relative: 40 % (ref 12–46)
Lymphs Abs: 3.1 10*3/uL (ref 0.7–4.0)
Monocytes Absolute: 0.8 10*3/uL (ref 0.1–1.0)
Monocytes Relative: 10 % (ref 3–12)
Neutro Abs: 3.7 10*3/uL (ref 1.7–7.7)
Neutrophils Relative %: 47 % (ref 43–77)

## 2011-08-21 LAB — APTT: aPTT: 37 seconds (ref 24–37)

## 2011-08-21 LAB — PROTIME-INR
INR: 1.1 (ref 0.00–1.49)
Prothrombin Time: 14.5 seconds (ref 11.6–15.2)

## 2012-06-02 ENCOUNTER — Other Ambulatory Visit: Payer: Self-pay | Admitting: Obstetrics and Gynecology

## 2012-06-02 DIAGNOSIS — Z1231 Encounter for screening mammogram for malignant neoplasm of breast: Secondary | ICD-10-CM

## 2012-06-16 ENCOUNTER — Ambulatory Visit
Admission: RE | Admit: 2012-06-16 | Discharge: 2012-06-16 | Disposition: A | Discharge status: Home / Self Care | Payer: BC Managed Care – PPO | Source: Ambulatory Visit | Attending: Obstetrics and Gynecology | Admitting: Obstetrics and Gynecology

## 2012-06-16 DIAGNOSIS — Z1231 Encounter for screening mammogram for malignant neoplasm of breast: Secondary | ICD-10-CM

## 2012-07-12 ENCOUNTER — Ambulatory Visit: Payer: BC Managed Care – PPO | Admitting: Obstetrics and Gynecology

## 2012-07-13 ENCOUNTER — Ambulatory Visit: Payer: BC Managed Care – PPO | Admitting: Obstetrics and Gynecology

## 2012-07-26 ENCOUNTER — Ambulatory Visit: Payer: BC Managed Care – PPO | Admitting: Obstetrics and Gynecology

## 2012-12-12 ENCOUNTER — Ambulatory Visit: Payer: BC Managed Care – PPO | Admitting: Obstetrics and Gynecology

## 2012-12-30 ENCOUNTER — Telehealth: Payer: Self-pay | Admitting: Obstetrics and Gynecology

## 2012-12-30 NOTE — Telephone Encounter (Signed)
Ar pt 

## 2013-01-03 ENCOUNTER — Telehealth: Payer: Self-pay | Admitting: Certified Nurse Midwife

## 2013-01-04 ENCOUNTER — Ambulatory Visit: Payer: BC Managed Care – PPO | Admitting: Certified Nurse Midwife

## 2013-01-04 VITALS — BP 118/64 | Resp 16 | Wt 175.0 lb

## 2013-01-04 DIAGNOSIS — B373 Candidiasis of vulva and vagina: Secondary | ICD-10-CM

## 2013-01-04 MED ORDER — METRONIDAZOLE 500 MG PO TABS: 500.0000 mg | ORAL_TABLET | Freq: Two times a day (BID) | ORAL | Status: DC

## 2013-01-04 MED ORDER — FLUCONAZOLE 100 MG PO TABS: ORAL_TABLET | ORAL | Status: DC

## 2013-01-04 NOTE — Progress Notes (Signed)
Vaginal discharge: whitethick mucoid Itching / Burning: yes Fever: no  Symptoms have been present for 3 weeks. Has used over-the-counter treatment: no Associated symptoms:  Pelvic pain: no       Dyspareunia: no     Odor:  yes  History of STD:  none STD screen:declined

## 2013-01-04 NOTE — Progress Notes (Signed)
Onset d/c with odor x 1 week No pain or irregular bleeding Partner without complaints O-  No CVAT  Abd-No masses, non-tender  Ext. Labia without lesions  Pelvic exam: D/C grayish with odor  Bimanual WNL Wet Prep: Clue cells with pos. whiff  Ass:  Bacteria Vaginitis  Plan:  Flagyl 500 mgs Bid for 7 days           Diflucan 150 mg x one prn yeast symptoms           Follow up prn and as scheduled for annual exam  C. Aiyannah Fayad, CNM, FNP

## 2013-01-04 NOTE — Telephone Encounter (Signed)
Pt has eval for this problem set up with Okey Regal on Wed the 19th . Melody Comas A

## 2013-01-13 ENCOUNTER — Emergency Department (HOSPITAL_COMMUNITY)
Admission: EM | Admit: 2013-01-13 | Discharge: 2013-01-14 | Disposition: A | Discharge status: Home / Self Care | Payer: BC Managed Care – PPO | Attending: Emergency Medicine | Admitting: Emergency Medicine

## 2013-01-13 ENCOUNTER — Encounter (HOSPITAL_COMMUNITY): Payer: Self-pay | Admitting: Emergency Medicine

## 2013-01-13 ENCOUNTER — Emergency Department (HOSPITAL_COMMUNITY): Payer: BC Managed Care – PPO

## 2013-01-13 DIAGNOSIS — R059 Cough, unspecified: Secondary | ICD-10-CM | POA: Insufficient documentation

## 2013-01-13 DIAGNOSIS — R0789 Other chest pain: Secondary | ICD-10-CM | POA: Insufficient documentation

## 2013-01-13 DIAGNOSIS — Z79899 Other long term (current) drug therapy: Secondary | ICD-10-CM | POA: Insufficient documentation

## 2013-01-13 DIAGNOSIS — R0602 Shortness of breath: Secondary | ICD-10-CM | POA: Insufficient documentation

## 2013-01-13 DIAGNOSIS — Z86718 Personal history of other venous thrombosis and embolism: Secondary | ICD-10-CM | POA: Insufficient documentation

## 2013-01-13 DIAGNOSIS — R05 Cough: Secondary | ICD-10-CM | POA: Insufficient documentation

## 2013-01-13 DIAGNOSIS — J029 Acute pharyngitis, unspecified: Secondary | ICD-10-CM | POA: Insufficient documentation

## 2013-01-13 DIAGNOSIS — R110 Nausea: Secondary | ICD-10-CM | POA: Insufficient documentation

## 2013-01-13 LAB — CBC WITH DIFFERENTIAL/PLATELET
Basophils Absolute: 0 10*3/uL (ref 0.0–0.1)
Basophils Relative: 1 % (ref 0–1)
Eosinophils Absolute: 0.1 10*3/uL (ref 0.0–0.7)
Eosinophils Relative: 2 % (ref 0–5)
HCT: 37.7 % (ref 36.0–46.0)
Hemoglobin: 13.1 g/dL (ref 12.0–15.0)
Lymphocytes Relative: 39 % (ref 12–46)
Lymphs Abs: 2.4 10*3/uL (ref 0.7–4.0)
MCH: 29.6 pg (ref 26.0–34.0)
MCHC: 34.7 g/dL (ref 30.0–36.0)
MCV: 85.3 fL (ref 78.0–100.0)
Monocytes Absolute: 0.3 10*3/uL (ref 0.1–1.0)
Monocytes Relative: 5 % (ref 3–12)
Neutro Abs: 3.2 10*3/uL (ref 1.7–7.7)
Neutrophils Relative %: 53 % (ref 43–77)
Platelets: 228 10*3/uL (ref 150–400)
RBC: 4.42 MIL/uL (ref 3.87–5.11)
RDW: 11.4 % — ABNORMAL LOW (ref 11.5–15.5)
WBC: 6 10*3/uL (ref 4.0–10.5)

## 2013-01-13 LAB — COMPREHENSIVE METABOLIC PANEL
ALT: 13 U/L (ref 0–35)
AST: 17 U/L (ref 0–37)
Albumin: 3.5 g/dL (ref 3.5–5.2)
Alkaline Phosphatase: 62 U/L (ref 39–117)
BUN: 17 mg/dL (ref 6–23)
CO2: 23 mEq/L (ref 19–32)
Calcium: 9 mg/dL (ref 8.4–10.5)
Chloride: 103 mEq/L (ref 96–112)
Creatinine, Ser: 0.88 mg/dL (ref 0.50–1.10)
GFR calc Af Amer: 90 mL/min — ABNORMAL LOW (ref >90)
GFR calc non Af Amer: 78 mL/min — ABNORMAL LOW (ref >90)
Glucose, Bld: 97 mg/dL (ref 70–99)
Potassium: 4 mEq/L (ref 3.5–5.1)
Sodium: 136 mEq/L (ref 135–145)
Total Bilirubin: 0.3 mg/dL (ref 0.3–1.2)
Total Protein: 7.3 g/dL (ref 6.0–8.3)

## 2013-01-13 LAB — D-DIMER, QUANTITATIVE: D-Dimer, Quant: <0.27 ug{FEU}/mL (ref 0.00–0.48)

## 2013-01-13 LAB — POCT I-STAT TROPONIN I: Troponin i, poc: 0 ng/mL (ref 0.00–0.08)

## 2013-01-13 NOTE — ED Notes (Signed)
PT. REPORTS  MID CHEST PAIN ONSET THIS EVENING WITH SOB AND NAUSEA / OCCASIONAL DRY COUGH . PT. TOOK 3 REGULAR ORAL ASA PRIOR TO ARRIVAL.

## 2013-01-14 LAB — POCT I-STAT TROPONIN I: Troponin i, poc: 0 ng/mL (ref 0.00–0.08)

## 2013-01-14 MED ORDER — OMEPRAZOLE 20 MG PO CPDR: DELAYED_RELEASE_CAPSULE | ORAL | Status: DC

## 2013-01-14 NOTE — ED Provider Notes (Signed)
History     CSN: [id number]  Arrival date & time 01/13/13  2140   First MD Initiated Contact with Patient 01/13/13 2301      Chief Complaint  Patient presents with  . Chest Pain    (Consider location/radiation/quality/duration/timing/severity/associated sxs/prior treatment) HPI Patient reports this evening she ate black bean soup and about 45 minutes later she started having a shooting burning pain in the central part of her chest that was also described as sharp. She states it lasted 30-45 seconds and would come and go. She denies getting any reflux symptoms up into her throat. She states she had mild shortness of breath and nausea however she denied sweating or vomiting. She's had a dry cough for a few days without fever. She's also had a mild sore throat for a few days. She denies any rhinorrhea. She states she's never had this before.  Patient reports she had DVT in her left calf about one year ago. She has been off Coumadin. She reports a recent plane trip to [facility] where she was on a plane for 5-6 hours. She did have some mild pain and swelling in her left leg after that flight but that is gone now.  PCP Dr Clois Dupes of [facility]  History reviewed. No pertinent past medical history.  Past Surgical History  Procedure Laterality Date  . Abdominal hysterectomy      No family history on file. MGF died in 21's MI  History  Substance Use Topics  . Smoking status: Never Smoker   . Smokeless tobacco: Not on file  . Alcohol Use: Yes  employed  OB History   Grav Para Term Preterm Abortions TAB SAB Ect Mult Living                  Review of Systems  All other systems reviewed and are negative.    Allergies  Sulfur  Home Medications   Current Outpatient Rx  Name  Route  Sig  Dispense  Refill  . fluconazole (DIFLUCAN) 100 MG tablet   Oral   Take 100 mg by mouth once.         . metroNIDAZOLE (FLAGYL) 500 MG tablet   Oral Has not started Taking yet  Take  1 tablet (500 mg total) by mouth 2 (two) times daily.   14 tablet   0     BP 100/64  Pulse 52  Temp(Src) 98.4 F (36.9 C) (Oral)  Resp 15  SpO2 100%  Vital signs normal for bradycardia (these were discharge vital signs when patient was sleepy)   Physical Exam  Nursing note and vitals reviewed. Constitutional: She is oriented to person, place, and time. She appears well-developed and well-nourished.  Non-toxic appearance. She does not appear ill. No distress.  HENT:  Head: Normocephalic and atraumatic.  Right Ear: External ear normal.  Left Ear: External ear normal.  Nose: Nose normal. No mucosal edema or rhinorrhea.  Mouth/Throat: Oropharynx is clear and moist and mucous membranes are normal. No dental abscesses or edematous.  Eyes: Conjunctivae and EOM are normal. Pupils are equal, round, and reactive to light.  Neck: Normal range of motion and full passive range of motion without pain. Neck supple.  Cardiovascular: Normal rate, regular rhythm and normal heart sounds.  Exam reveals no gallop and no friction rub.   No murmur heard. Pulmonary/Chest: Effort normal and breath sounds normal. No respiratory distress. She has no wheezes. She has no rhonchi. She has no rales. She  exhibits no tenderness and no crepitus.    Area of pain noted  Abdominal: Soft. Normal appearance and bowel sounds are normal. She exhibits no distension. There is no tenderness. There is no rebound and no guarding.  Musculoskeletal: Normal range of motion. She exhibits no edema and no tenderness.  Moves all extremities well. No obvious swelling of the left lower extremity, no tenderness or cords felt in the left calf  Neurological: She is alert and oriented to person, place, and time. She has normal strength. No cranial nerve deficit.  Skin: Skin is warm, dry and intact. No rash noted. No erythema. No pallor.  Psychiatric: She has a normal mood and affect. Her speech is normal and behavior is normal. Her mood  appears not anxious.    ED Course  Procedures (including critical care time)  Patient has no evidence of non-STEMI or acute DVT with normal d-dimer and troponins. She will be treated for possible reflux related to what she ate before the symptoms started.  Results for orders placed during the hospital encounter of 01/13/13  CBC WITH DIFFERENTIAL      Result Value Range   WBC 6.0  4.0 - 10.5 K/uL   RBC 4.42  3.87 - 5.11 MIL/uL   Hemoglobin 13.1  12.0 - 15.0 g/dL   HCT 45.4  09.8 - 11.9 %   MCV 85.3  78.0 - 100.0 fL   MCH 29.6  26.0 - 34.0 pg   MCHC 34.7  30.0 - 36.0 g/dL   RDW 14.7 (*) 82.9 - 56.2 %   Platelets 228  150 - 400 K/uL   Neutrophils Relative 53  43 - 77 %   Neutro Abs 3.2  1.7 - 7.7 K/uL   Lymphocytes Relative 39  12 - 46 %   Lymphs Abs 2.4  0.7 - 4.0 K/uL   Monocytes Relative 5  3 - 12 %   Monocytes Absolute 0.3  0.1 - 1.0 K/uL   Eosinophils Relative 2  0 - 5 %   Eosinophils Absolute 0.1  0.0 - 0.7 K/uL   Basophils Relative 1  0 - 1 %   Basophils Absolute 0.0  0.0 - 0.1 K/uL  COMPREHENSIVE METABOLIC PANEL      Result Value Range   Sodium 136  135 - 145 mEq/L   Potassium 4.0  3.5 - 5.1 mEq/L   Chloride 103  96 - 112 mEq/L   CO2 23  19 - 32 mEq/L   Glucose, Bld 97  70 - 99 mg/dL   BUN 17  6 - 23 mg/dL   Creatinine, Ser 1.30  0.50 - 1.10 mg/dL   Calcium 9.0  8.4 - 86.5 mg/dL   Total Protein 7.3  6.0 - 8.3 g/dL   Albumin 3.5  3.5 - 5.2 g/dL   AST 17  0 - 37 U/L   ALT 13  0 - 35 U/L   Alkaline Phosphatase 62  39 - 117 U/L   Total Bilirubin 0.3  0.3 - 1.2 mg/dL   GFR calc non Af Amer 78 (*) >90 mL/min   GFR calc Af Amer 90 (*) >90 mL/min  D-DIMER, QUANTITATIVE      Result Value Range   D-Dimer, Quant <0.27  0.00 - 0.48 ug/mL-FEU  POCT I-STAT TROPONIN I      Result Value Range   Troponin i, poc 0.00  0.00 - 0.08 ng/mL   Comment 3  POCT I-STAT TROPONIN I      Result Value Range   Troponin i, poc 0.00  0.00 - 0.08 ng/mL   Comment 3             Laboratory interpretation all normal Dg Chest 2 View  01/13/2013  *RADIOLOGY REPORT*  Clinical Data: 47 year old female with chest pain.  CHEST - 2 VIEW  Comparison: None.  Findings: Mildly low lung volumes.  Cardiac size and mediastinal contours are within normal limits.  Visualized tracheal air column is within normal limits.  No pneumothorax, pulmonary edema, pleural effusion or confluent pulmonary opacity.  Minimal scoliosis. No acute osseous abnormality identified.  IMPRESSION: No acute cardiopulmonary abnormality.   Original Report Authenticated By: Erskine Speed, M.D.      Date: 01/14/2013  Rate: 62  Rhythm: normal sinus rhythm  QRS Axis: normal  Intervals: PR prolonged  ST/T Wave abnormalities: nonspecific T wave changes  Conduction Disutrbances:none  Narrative Interpretation: PRWP  Old EKG Reviewed: none available    1. Atypical chest pain    New Prescriptions   OMEPRAZOLE (PRILOSEC) 20 MG CAPSULE    Take 1 po BID x 2 weeks then once a day    Plan discharge    MDM          Ward Givens, MD 01/14/13 0151

## 2013-01-14 NOTE — ED Notes (Signed)
Latest istat troponin = 0

## 2013-09-11 ENCOUNTER — Ambulatory Visit (INDEPENDENT_AMBULATORY_CARE_PROVIDER_SITE_OTHER): Payer: BC Managed Care – PPO | Admitting: Family Medicine

## 2013-09-11 VITALS — BP 106/66 | HR 56 | Temp 98.5°F | Resp 16

## 2013-09-11 DIAGNOSIS — S0083XA Contusion of other part of head, initial encounter: Secondary | ICD-10-CM

## 2013-09-11 DIAGNOSIS — S0003XA Contusion of scalp, initial encounter: Secondary | ICD-10-CM

## 2013-09-11 DIAGNOSIS — S0990XA Unspecified injury of head, initial encounter: Secondary | ICD-10-CM

## 2013-09-11 NOTE — Patient Instructions (Signed)
Head Injury, Adult °You have had a head injury that does not appear serious at this time. A concussion is a state of changed mental ability, usually from a blow to the head. You should take clear liquids for the rest of the day and then resume your regular diet. You should not take sedatives or alcoholic beverages for as long as directed by your caregiver after discharge. After injuries such as yours, most problems occur within the first 24 hours. °SYMPTOMS °These minor symptoms may be experienced after discharge: °· Memory difficulties. °· Dizziness. °· Headaches. °· Double vision. °· Hearing difficulties. °· Depression. °· Tiredness. °· Weakness. °· Difficulty with concentration. °If you experience any of these problems, you should not be alarmed. A concussion requires a few days for recovery. Many patients with head injuries frequently experience such symptoms. Usually, these problems disappear without medical care. If symptoms last for more than one day, notify your caregiver. See your caregiver sooner if symptoms are becoming worse rather than better. °HOME CARE INSTRUCTIONS  °· During the next 24 hours you must stay with someone who can watch you for the warning signs listed below. °Although it is unlikely that serious side effects will occur, you should be aware of signs and symptoms which may necessitate your return to this location. Side effects may occur up to 7  10 days following the injury. It is important for you to carefully monitor your condition and contact your caregiver or seek immediate medical attention if there is a change in your condition. °SEEK IMMEDIATE MEDICAL CARE IF:  °· There is confusion or drowsiness. °· You can not awaken the injured person. °· There is nausea (feeling sick to your stomach) or continued, forceful vomiting. °· You notice dizziness or unsteadiness which is getting worse, or inability to walk. °· You have convulsions or unconsciousness. °· You experience severe,  persistent headaches not relieved by over-the-counter or prescription medicines for pain. (Do not take aspirin as this impairs clotting abilities). Take other pain medications only as directed. °· You can not use arms or legs normally. °· There is clear or bloody discharge from the nose or ears. °MAKE SURE YOU:  °· Understand these instructions. °· Will watch your condition. °· Will get help right away if you are not doing well or get worse. °Document Released: 11/02/2005 Document Revised: 01/25/2012 Document Reviewed: 09/20/2009 °[employer]® Patient Information ©2014 [employer]. ° °

## 2013-09-11 NOTE — Progress Notes (Signed)
Subjective: Patient was trying to get something out of her trunk this morning. Hold out a folding chair, which was: 7 cough colds. It also gave way and the leg of the chair hit her in the for his just above the right eyebrow. She quickly developed a large hematoma. It is gone down some, a coworker urged her to come make sure she did not have any loss of consciousness. She was a little nauseated this afternoon apparently.   Objective: 3 CM hematoma right forehead. No apparent black. She's tender only at a hematoma. Eyes PERRLA. Romberg negative. No palmar drift. Coordination normal. Gait normal.  Assessment: Closed head injury hematoma of forehead  Plan: Reassurance. Return if problems. Closed head injury instructions.

## 2014-09-13 ENCOUNTER — Other Ambulatory Visit: Payer: Self-pay | Admitting: Obstetrics and Gynecology

## 2014-09-13 DIAGNOSIS — Z1231 Encounter for screening mammogram for malignant neoplasm of breast: Secondary | ICD-10-CM

## 2014-11-05 ENCOUNTER — Ambulatory Visit
Admission: RE | Admit: 2014-11-05 | Discharge: 2014-11-05 | Disposition: A | Discharge status: Home / Self Care | Payer: BC Managed Care – PPO | Source: Ambulatory Visit | Attending: Obstetrics and Gynecology | Admitting: Obstetrics and Gynecology

## 2014-11-05 DIAGNOSIS — Z1231 Encounter for screening mammogram for malignant neoplasm of breast: Secondary | ICD-10-CM

## 2017-05-10 ENCOUNTER — Other Ambulatory Visit: Payer: Self-pay | Admitting: *Deleted

## 2017-05-10 ENCOUNTER — Other Ambulatory Visit: Payer: Self-pay | Admitting: Obstetrics and Gynecology

## 2017-05-10 DIAGNOSIS — Z1231 Encounter for screening mammogram for malignant neoplasm of breast: Secondary | ICD-10-CM

## 2017-05-28 ENCOUNTER — Ambulatory Visit: Payer: BC Managed Care – PPO

## 2017-06-11 ENCOUNTER — Ambulatory Visit
Admission: RE | Admit: 2017-06-11 | Discharge: 2017-06-11 | Disposition: A | Discharge status: Home / Self Care | Payer: BC Managed Care – PPO | Source: Ambulatory Visit | Attending: Obstetrics and Gynecology | Admitting: Obstetrics and Gynecology

## 2017-06-11 DIAGNOSIS — Z1231 Encounter for screening mammogram for malignant neoplasm of breast: Secondary | ICD-10-CM

## 2018-07-28 ENCOUNTER — Emergency Department (HOSPITAL_BASED_OUTPATIENT_CLINIC_OR_DEPARTMENT_OTHER): Payer: BC Managed Care – PPO

## 2018-07-28 ENCOUNTER — Encounter (HOSPITAL_BASED_OUTPATIENT_CLINIC_OR_DEPARTMENT_OTHER): Payer: Self-pay

## 2018-07-28 ENCOUNTER — Other Ambulatory Visit: Payer: Self-pay

## 2018-07-28 ENCOUNTER — Emergency Department (HOSPITAL_BASED_OUTPATIENT_CLINIC_OR_DEPARTMENT_OTHER)
Admission: EM | Admit: 2018-07-28 | Discharge: 2018-07-28 | Disposition: A | Discharge status: Home / Self Care | Payer: BC Managed Care – PPO | Attending: Emergency Medicine | Admitting: Emergency Medicine

## 2018-07-28 DIAGNOSIS — Z79899 Other long term (current) drug therapy: Secondary | ICD-10-CM | POA: Insufficient documentation

## 2018-07-28 DIAGNOSIS — R0789 Other chest pain: Secondary | ICD-10-CM | POA: Insufficient documentation

## 2018-07-28 DIAGNOSIS — R079 Chest pain, unspecified: Secondary | ICD-10-CM

## 2018-07-28 HISTORY — DX: Pure hypercholesterolemia, unspecified: E78.00

## 2018-07-28 HISTORY — DX: Acute embolism and thrombosis of unspecified deep veins of unspecified lower extremity: I82.409

## 2018-07-28 LAB — BASIC METABOLIC PANEL
Anion gap: 7 (ref 5–15)
BUN: 17 mg/dL (ref 6–20)
CO2: 27 mmol/L (ref 22–32)
Calcium: 8.7 mg/dL — ABNORMAL LOW (ref 8.9–10.3)
Chloride: 105 mmol/L (ref 98–111)
Creatinine, Ser: 0.81 mg/dL (ref 0.44–1.00)
GFR calc Af Amer: >60 mL/min (ref >60)
GFR calc non Af Amer: >60 mL/min (ref >60)
Glucose, Bld: 87 mg/dL (ref 70–99)
Potassium: 4.1 mmol/L (ref 3.5–5.1)
Sodium: 139 mmol/L (ref 135–145)

## 2018-07-28 LAB — CBC WITH DIFFERENTIAL/PLATELET
Basophils Absolute: 0 10*3/uL (ref 0.0–0.1)
Basophils Relative: 1 %
Eosinophils Absolute: 0.1 10*3/uL (ref 0.0–0.7)
Eosinophils Relative: 2 %
HCT: 39.8 % (ref 36.0–46.0)
Hemoglobin: 13.2 g/dL (ref 12.0–15.0)
Lymphocytes Relative: 32 %
Lymphs Abs: 1.6 10*3/uL (ref 0.7–4.0)
MCH: 29.4 pg (ref 26.0–34.0)
MCHC: 33.2 g/dL (ref 30.0–36.0)
MCV: 88.6 fL (ref 78.0–100.0)
Monocytes Absolute: 0.3 10*3/uL (ref 0.1–1.0)
Monocytes Relative: 7 %
Neutro Abs: 3 10*3/uL (ref 1.7–7.7)
Neutrophils Relative %: 58 %
Platelets: 238 10*3/uL (ref 150–400)
RBC: 4.49 MIL/uL (ref 3.87–5.11)
RDW: 11.6 % (ref 11.5–15.5)
WBC: 5 10*3/uL (ref 4.0–10.5)

## 2018-07-28 LAB — TROPONIN I
Troponin I: <0.03 ng/mL (ref <0.03)
Troponin I: <0.03 ng/mL (ref <0.03)

## 2018-07-28 LAB — MAGNESIUM: Magnesium: 2 mg/dL (ref 1.7–2.4)

## 2018-07-28 NOTE — ED Triage Notes (Signed)
C/o CP this am that lasted x 5 min-NAD-steady gait

## 2018-07-28 NOTE — ED Provider Notes (Signed)
[facility] EMERGENCY DEPARTMENT Provider Note   CSN: [id number] Arrival date & time: 07/28/18  1255     History   Chief Complaint Chief Complaint  Patient presents with  . Chest Pain    HPI TIYANA GALLA is a 52 y.o. female.  52 year old female presents with complaint of chest pain.  Patient states that she was at school today, works as an Geophysicist/field seismologist principal, developed gradual onset of burning/stabbing pain, midsternal, slight discomfort in her back and left arm.  States that she felt a little sweaty at that time, denies nausea, vomiting, shortness of breath.  Patient states the pain lasted for about 5 minutes and then has gradually resolved, is pain-free at this time.  She states that she has had similar pain in the past that was not as painful and was related to reflux.  She did eat a sausage patty and eggs for breakfast today.  Family history significant for her father who had COPD and an MI at age 45.  Patient has a history of high cholesterol, does not take medication for this, no history of hypertension or diabetes.  Patient is a non-smoker.  No other complaints or concerns.     Past Medical History:  Diagnosis Date  . DVT (deep venous thrombosis) (HCC)   . High cholesterol     There are no active problems to display for this patient.   Past Surgical History:  Procedure Laterality Date  . ABDOMINAL HYSTERECTOMY       OB History   None      Home Medications    Prior to Admission medications   Medication Sig Start Date End Date Taking? Authorizing Provider  omeprazole (PRILOSEC) 20 MG capsule Take 1 po BID x 2 weeks then once a day 01/14/13   Devoria Albe, MD    Family History Family History  Problem Relation Age of Onset  . Cancer Mother   . Lung disease Mother   . Heart disease Maternal Grandfather     Social History Social History   Tobacco Use  . Smoking status: Never Smoker  . Smokeless tobacco: Never Used  Substance Use Topics  .  Alcohol use: Yes    Comment: occ  . Drug use: No     Allergies   Sulfur   Review of Systems Review of Systems  Constitutional: Positive for diaphoresis. Negative for chills and fever.  Respiratory: Negative for shortness of breath.   Cardiovascular: Positive for chest pain. Negative for palpitations and leg swelling.  Gastrointestinal: Negative for abdominal pain, nausea and vomiting.  Genitourinary: Negative for dysuria.  Musculoskeletal: Negative for arthralgias and myalgias.  Skin: Negative for rash and wound.  Allergic/Immunologic: Negative for immunocompromised state.  Neurological: Negative for dizziness, weakness and headaches.  Psychiatric/Behavioral: Negative for confusion.  All other systems reviewed and are negative.    Physical Exam Updated Vital Signs BP 110/71   Pulse (!) 50   Temp 98.5 F (36.9 C) (Oral)   Resp 16   Ht 5\' 1"  (1.549 m)   Wt 82.6 kg   SpO2 99%   BMI 34.39 kg/m   Physical Exam  Constitutional: She is oriented to person, place, and time. She appears well-developed and well-nourished.  Non-toxic appearance. She does not appear ill. No distress.  HENT:  Head: Normocephalic and atraumatic.  Cardiovascular: Regular rhythm, intact distal pulses and normal pulses. Bradycardia present.  No murmur heard. Pulmonary/Chest: Effort normal and breath sounds normal.  Abdominal: Soft. There is  no tenderness.  Neurological: She is alert and oriented to person, place, and time.  Skin: Skin is warm and dry. She is not diaphoretic.  Psychiatric: She has a normal mood and affect. Her behavior is normal.  Nursing note and vitals reviewed.    ED Treatments / Results  Labs (all labs ordered are listed, but only abnormal results are displayed) Labs Reviewed  BASIC METABOLIC PANEL - Abnormal; Notable for the following components:      Result Value   Calcium 8.7 (*)    All other components within normal limits  CBC WITH DIFFERENTIAL/PLATELET  TROPONIN  I  MAGNESIUM  TROPONIN I    EKG EKG Interpretation  Date/Time:  Thursday July 28 2018 13:02:37 EDT Ventricular Rate:  43 PR Interval:  194 QRS Duration: 70 QT Interval:  468 QTC Calculation: 395 R Axis:   23 Text Interpretation:  Sinus bradycardia Low voltage QRS Cannot rule out Anterior infarct , age undetermined Abnormal ECG rate slower, but otherwise unchanged compared to 2014 Confirmed by Pricilla LovelessGoldston, Scott [phone]) on 07/28/2018 1:06:17 PM Also confirmed by Pricilla LovelessGoldston, Scott [phone]), editor Sheppard EvensSimpson, Miranda ([phone])  on 07/28/2018 4:23:05 PM   Radiology Dg Chest 2 View  Result Date: 07/28/2018 CLINICAL DATA:  Cough and chest pain EXAM: CHEST - 2 VIEW COMPARISON:  January 13, 2013 FINDINGS: No edema or consolidation. The heart size and pulmonary vascularity are. No adenopathy. No bone lesions. No pneumothorax. IMPRESSION: No edema or consolidation. Electronically Signed   By: Bretta BangWilliam  Woodruff III M.D.   On: 07/28/2018 13:32    Procedures Procedures (including critical care time)  Medications Ordered in ED Medications - No data to display   Initial Impression / Assessment and Plan / ED Course  I have reviewed the triage vital signs and the nursing notes.  Pertinent labs & imaging results that were available during my care of the patient were reviewed by me and considered in my medical decision making (see chart for details).  Clinical Course as of Jul 28 1756  Thu Jul 28, 2018  [id number] 52 year old female presents with complaint of midsternal chest pain onset 850 this morning.  Pain has resolved as of this time.  Heart score is 3 based on family history, history of high cholesterol, obesity, age, midsternal chest pain.  Initial evaluation including troponin normal, EKG with sinus bradycardia with a rate of 43, patient states that she normally has a heart rate in the 50s.  Plan is to repeat troponin.   [LM]  1756 Repeat troponin is negative, patient remains pain-free.  Patient  referred to PCP for follow-up to discuss stress test.  Advised to return to the ER for any worsening or concerning symptoms.   [LM]    Clinical Course User Index [LM] Jeannie FendMurphy, Brihany Butch A, PA-C    Final Clinical Impressions(s) / ED Diagnoses   Final diagnoses:  Nonspecific chest pain    ED Discharge Orders    None       Jeannie FendMurphy, Chara Marquard A, PA-C 07/28/18 1757    Pricilla LovelessGoldston, Scott, MD 07/29/18 [phone]

## 2018-07-28 NOTE — Discharge Instructions (Addendum)
Follow up with your family doctor to discuss scheduling a stress test.  Return to the emergency room for any worsening or concerning symptoms.

## 2019-08-18 ENCOUNTER — Ambulatory Visit: Payer: BC Managed Care – PPO | Admitting: Allergy

## 2019-08-25 ENCOUNTER — Encounter: Payer: Self-pay | Admitting: Allergy

## 2019-08-25 ENCOUNTER — Other Ambulatory Visit: Payer: Self-pay

## 2019-08-25 ENCOUNTER — Ambulatory Visit: Payer: BC Managed Care – PPO | Admitting: Allergy

## 2019-08-25 VITALS — BP 102/64 | HR 76 | Temp 98.2°F | Resp 20 | Ht 61.0 in | Wt 192.9 lb

## 2019-08-25 DIAGNOSIS — J3089 Other allergic rhinitis: Secondary | ICD-10-CM | POA: Diagnosis not present

## 2019-08-25 DIAGNOSIS — T781XXA Other adverse food reactions, not elsewhere classified, initial encounter: Secondary | ICD-10-CM | POA: Insufficient documentation

## 2019-08-25 DIAGNOSIS — T7819XA Other adverse food reactions, not elsewhere classified, initial encounter: Secondary | ICD-10-CM | POA: Insufficient documentation

## 2019-08-25 DIAGNOSIS — T781XXD Other adverse food reactions, not elsewhere classified, subsequent encounter: Secondary | ICD-10-CM | POA: Diagnosis not present

## 2019-08-25 DIAGNOSIS — H1013 Acute atopic conjunctivitis, bilateral: Secondary | ICD-10-CM | POA: Insufficient documentation

## 2019-08-25 MED ORDER — FLUTICASONE PROPIONATE 50 MCG/ACT NA SUSP: NASAL | 5 refills | Status: DC

## 2019-08-25 MED ORDER — EPINEPHRINE 0.3 MG/0.3ML IJ SOAJ: INTRAMUSCULAR | 1 refills | Status: DC

## 2019-08-25 NOTE — Assessment & Plan Note (Addendum)
Perioral pruritus with episodes of nausea and vomiting with fresh fruits and vegetables since a young child.  Tolerates processed/cooked fruits and vegetables with no issues.  Some history of reflux.  Skin testing around age 53 showed multiple positives per patient report.  She never had an epinephrine device and no previous evaluation ER evaluation for this.   Today's skin testing was borderline positive to peanuts, tree nuts, onion, carrots, celery and cucumber.  Continue to avoid foods that bother you - fresh fruits and fresh vegetables, peanuts, tree nuts.   I have prescribed epinephrine injectable and demonstrated proper use. For mild symptoms you can take over the counter antihistamines such as Benadryl and monitor symptoms closely. If symptoms worsen or if you have severe symptoms including breathing issues, throat closure, significant swelling, whole body hives, severe diarrhea and vomiting, lightheadedness then inject epinephrine and seek immediate medical care afterwards.  Food action plan given.  Discussed that her food triggered oral and throat symptoms are likely caused by oral food allergy syndrome (OFAS). This is caused by cross reactivity of pollen with fresh fruits and vegetables, and nuts. Symptoms are usually localized in the form of itching and burning in mouth and throat. Very rarely it can progress to more severe symptoms. Eating foods in cooked or processed forms usually minimizes symptoms. I recommended avoidance of eating the problem foods, especially during the peak season(s). Sometimes, OFAS can induce severe throat swelling or even a systemic reaction; with such instance, I advised them to report to a local ER. A list of common pollens and food cross-reactivities was provided to the patient.

## 2019-08-25 NOTE — Assessment & Plan Note (Signed)
   See assessment and plan as above for allergic rhinitis.  

## 2019-08-25 NOTE — Patient Instructions (Addendum)
Today's skin testing showed: Positive to grass, ragweed, weed, tree, mold, dust mites, cat, dog.  Start environmental control measures.  May use over the counter antihistamines such as Zyrtec (cetirizine), Claritin (loratadine), Allegra (fexofenadine), or Xyzal (levocetirizine) daily as needed.  May use Flonase 1-2 sprays per nostril daily as needed for nasal congestion.  Read about allergy injections as this may help with your seasonal/environmental allergies and also with the oral allergy syndrome.  Food  Today's skin testing was borderline positive to peanuts, tree nuts, onion, carrots, celery and cucumber.  Continue to avoid foods that bother you - fresh fruits and fresh vegetables, peanuts, tree nuts.   I have prescribed epinephrine injectable and demonstrated proper use. For mild symptoms you can take over the counter antihistamines such as Benadryl and monitor symptoms closely. If symptoms worsen or if you have severe symptoms including breathing issues, throat closure, significant swelling, whole body hives, severe diarrhea and vomiting, lightheadedness then inject epinephrine and seek immediate medical care afterwards.  Food action plan given.  Discussed that her food triggered oral and throat symptoms are likely caused by oral food allergy syndrome (OFAS). This is caused by cross reactivity of pollen with fresh fruits and vegetables, and nuts. Symptoms are usually localized in the form of itching and burning in mouth and throat. Very rarely it can progress to more severe symptoms. Eating foods in cooked or processed forms usually minimizes symptoms. I recommended avoidance of eating the problem foods, especially during the peak season(s). Sometimes, OFAS can induce severe throat swelling or even a systemic reaction; with such instance, I advised them to report to a local ER. A list of common pollens and food cross-reactivities was provided to the patient.   Follow up in 4 months or  sooner if needed.  Reducing Pollen Exposure . Pollen seasons: trees (spring), grass (summer) and ragweed/weeds (fall). Marland Kitchen Keep windows closed in your home and car to lower pollen exposure.  Lilian Kapur air conditioning in the bedroom and throughout the house if possible.  . Avoid going out in dry windy days - especially early morning. . Pollen counts are highest between 5 - 10 AM and on dry, hot and windy days.  . Save outside activities for late afternoon or after a heavy rain, when pollen levels are lower.  . Avoid mowing of grass if you have grass pollen allergy. Marland Kitchen Be aware that pollen can also be transported indoors on people and pets.  . Dry your clothes in an automatic dryer rather than hanging them outside where they might collect pollen.  . Rinse hair and eyes before bedtime. Control of House Dust Mite Allergen . Dust mite allergens are a common trigger of allergy and asthma symptoms. While they can be found throughout the house, these microscopic creatures thrive in warm, humid environments such as bedding, upholstered furniture and carpeting. . Because so much time is spent in the bedroom, it is essential to reduce mite levels there.  . Encase pillows, mattresses, and box springs in special allergen-proof fabric covers or airtight, zippered plastic covers.  . Bedding should be washed weekly in hot water (130 F) and dried in a hot dryer. Allergen-proof covers are available for comforters and pillows that can't be regularly washed.  Reyes Ivan the allergy-proof covers every few months. Minimize clutter in the bedroom. Keep pets out of the bedroom.  Marland Kitchen Keep humidity less than 50% by using a dehumidifier or air conditioning. You can buy a humidity measuring device called a  hygrometer to monitor this.  . If possible, replace carpets with hardwood, linoleum, or washable area rugs. If that's not possible, vacuum frequently with a vacuum that has a HEPA filter. . Remove all upholstered furniture and  non-washable window drapes from the bedroom. . Remove all non-washable stuffed toys from the bedroom.  Wash stuffed toys weekly. Pet Allergen Avoidance: . Contrary to popular opinion, there are no "hypoallergenic" breeds of dogs or cats. That is because people are not allergic to an animal's hair, but to an allergen found in the animal's saliva, dander (dead skin flakes) or urine. Pet allergy symptoms typically occur within minutes. For some people, symptoms can build up and become most severe 8 to 12 hours after contact with the animal. People with severe allergies can experience reactions in public places if dander has been transported on the pet owners' clothing. Marland Kitchen Keeping an animal outdoors is only a partial solution, since homes with pets in the yard still have higher concentrations of animal allergens. . Before getting a pet, ask your allergist to determine if you are allergic to animals. If your pet is already considered part of your family, try to minimize contact and keep the pet out of the bedroom and other rooms where you spend a great deal of time. . As with dust mites, vacuum carpets often or replace carpet with a hardwood floor, tile or linoleum. . High-efficiency particulate air (HEPA) cleaners can reduce allergen levels over time. . While dander and saliva are the source of cat and dog allergens, urine is the source of allergens from rabbits, hamsters, mice and [place] pigs; so ask a non-allergic family member to clean the animal's cage. . If you have a pet allergy, talk to your allergist about the potential for allergy immunotherapy (allergy shots). This strategy can often provide long-term relief. Mold Control . Mold and fungi can grow on a variety of surfaces provided certain temperature and moisture conditions exist.  . Outdoor molds grow on plants, decaying vegetation and soil. The major outdoor mold, Alternaria and Cladosporium, are found in very high numbers during hot and dry  conditions. Generally, a late summer - fall peak is seen for common outdoor fungal spores. Rain will temporarily lower outdoor mold spore count, but counts rise rapidly when the rainy period ends. . The most important indoor molds are Aspergillus and Penicillium. Dark, humid and poorly ventilated basements are ideal sites for mold growth. The next most common sites of mold growth are the bathroom and the kitchen. Outdoor (Seasonal) Mold Control . Use air conditioning and keep windows closed. . Avoid exposure to decaying vegetation. Marland Kitchen Avoid leaf raking. . Avoid grain handling. . Consider wearing a face mask if working in moldy areas.  Indoor (Perennial) Mold Control  . Maintain humidity below 50%. . Get rid of mold growth on hard surfaces with water, detergent and, if necessary, 5% bleach (do not mix with other cleaners). Then dry the area completely. If mold covers an area more than 10 square feet, consider hiring an indoor environmental professional. . For clothing, washing with soap and water is best. If moldy items cannot be cleaned and dried, throw them away. . Remove sources e.g. contaminated carpets. . Repair and seal leaking roofs or pipes. Using dehumidifiers in damp basements may be helpful, but empty the water and clean units regularly to prevent mildew from forming. All rooms, especially basements, bathrooms and kitchens, require ventilation and cleaning to deter mold and mildew growth. Avoid carpeting on concrete  or damp floors, and storing items in damp areas.

## 2019-08-25 NOTE — Assessment & Plan Note (Signed)
   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

## 2019-08-25 NOTE — Assessment & Plan Note (Signed)
Rhinoconjunctivitis symptoms mainly from spring through fall for 40+ years.  Using Flonase, over-the-counter antihistamines with some benefit.  Skin testing as a child showed multiple positives per patient report.  No previous allergy immunotherapy.  Today's skin testing showed: Positive to grass, ragweed, weed, tree, mold, dust mites, cat, dog.  Start environmental control measures.  May use over the counter antihistamines such as Zyrtec (cetirizine), Claritin (loratadine), Allegra (fexofenadine), or Xyzal (levocetirizine) daily as needed.  May use Flonase 1-2 sprays per nostril daily as needed for nasal congestion.  Declines eyedrops at this time.  Had a detailed discussion with patient/family that clinical history is suggestive of allergic rhinitis, and may benefit from allergy immunotherapy (AIT). Discussed in detail regarding the dosing, schedule, side effects (mild to moderate local allergic reaction and rarely systemic allergic reactions including anaphylaxis), and benefits (significant improvement in nasal symptoms, seasonal flares of asthma) of immunotherapy with the patient. There is significant time commitment involved with allergy shots, which includes weekly immunotherapy injections for first 9-12 months and then biweekly to monthly injections for 3-5 years.   Allergy immunotherapy may also help with her oral allergy syndrome in the long-term.  Patient will let us know if ready to start but was concerned about the time commitment involved.

## 2019-08-25 NOTE — Progress Notes (Signed)
New Patient Note  RE: Sandra Thornton MRN: [id number] DOB: [birth year] Date of Office Visit: 08/25/2019  Referring provider: Donald Prose, MD Primary care provider: Donald Prose, MD  Chief Complaint: Allergic Rhinitis  and Food Intolerance  History of Present Illness: I had the pleasure of seeing Sandra Thornton for initial evaluation at the [facility] on 08/25/2019. [name], who is referred here by Donald Prose, MD for the evaluation of food allergies.  Food: She reports food allergy to fresh vegetables, fresh fruits.  The reaction started to happen as a child. She noticed fresh vegetables and fresh fruits cause perioral pruritus.  Apples, oranges, tomatoes, nuts cause vomiting sometimes.  Processed/cooked fruits and vegetables do not give her the same issues.  Some history of reflux.   Symptoms usually start within a few minutes and was in the form of perioral pruritis, vomiting. Last reaction was about 1 year ago. The symptoms lasted for few hours. She was not evaluated in ED. She does not have access to epinephrine autoinjector.  Past work up includes: skin prick testing around age 1 which showed multiple positives per patient report.  Dietary History: patient has been eating other foods including milk, eggs, sesame, shellfish, seafood, soy, wheat, meats.  The only raw foods she can have is spinach and pineapple.   She reports reading labels and avoiding fresh fruits and vegetables, peanuts, tree nuts in diet completely.    Rhinitis: She reports symptoms of coughing, sneezing, nasal congestion, rhinorrhea, itchy/watery eyes. Symptoms have been going on for 40+ years. The symptoms are present from the spring through fall. Other triggers include exposure to pollen. Anosmia: no. Headache: yes. She has used Flonase 1 spray prn, zyrtec, Claritin, allegra, benadryl with some improvement in symptoms. Sinus infections: no. Previous work up includes: skin  testing as a child showed multiple positives per patient report. No previous AIT. Previous ENT evaluation: no. Previous sinus imaging: no. Last eye exam: last year.  Assessment and Plan: Sandra Thornton is a 53 y.o. Thornton with: Pollen-food allergy Perioral pruritus with episodes of nausea and vomiting with fresh fruits and vegetables since a young child.  Tolerates processed/cooked fruits and vegetables with no issues.  Some history of reflux.  Skin testing around age 61 showed multiple positives per patient report.  She never had an epinephrine device and no previous evaluation ER evaluation for this.   Today's skin testing was borderline positive to peanuts, tree nuts, onion, carrots, celery and cucumber.  Continue to avoid foods that bother you - fresh fruits and fresh vegetables, peanuts, tree nuts.   I have prescribed epinephrine injectable and demonstrated proper use. For mild symptoms you can take over the counter antihistamines such as Benadryl and monitor symptoms closely. If symptoms worsen or if you have severe symptoms including breathing issues, throat closure, significant swelling, whole body hives, severe diarrhea and vomiting, lightheadedness then inject epinephrine and seek immediate medical care afterwards.  Food action plan given.  Discussed that her food triggered oral and throat symptoms are likely caused by oral food allergy syndrome (OFAS). This is caused by cross reactivity of pollen with fresh fruits and vegetables, and nuts. Symptoms are usually localized in the form of itching and burning in mouth and throat. Very rarely it can progress to more severe symptoms. Eating foods in cooked or processed forms usually minimizes symptoms. I recommended avoidance of eating the problem foods, especially during the peak season(s). Sometimes, OFAS can induce  severe throat swelling or even a systemic reaction; with such instance, I advised them to report to a local ER. A list of common pollens  and food cross-reactivities was provided to the patient.   Adverse food reaction  See assessment as plan as above for oral allergy syndrome.  Other allergic rhinitis Rhinoconjunctivitis symptoms mainly from spring through fall for 40+ years.  Using Flonase, over-the-counter antihistamines with some benefit.  Skin testing as a child showed multiple positives per patient report.  No previous allergy immunotherapy.  Today's skin testing showed: Positive to grass, ragweed, weed, tree, mold, dust mites, cat, dog.  Start environmental control measures.  May use over the counter antihistamines such as Zyrtec (cetirizine), Claritin (loratadine), Allegra (fexofenadine), or Xyzal (levocetirizine) daily as needed.  May use Flonase 1-2 sprays per nostril daily as needed for nasal congestion.  Declines eyedrops at this time.  Had a detailed discussion with patient/family that clinical history is suggestive of allergic rhinitis, and may benefit from allergy immunotherapy (AIT). Discussed in detail regarding the dosing, schedule, side effects (mild to moderate local allergic reaction and rarely systemic allergic reactions including anaphylaxis), and benefits (significant improvement in nasal symptoms, seasonal flares of asthma) of immunotherapy with the patient. There is significant time commitment involved with allergy shots, which includes weekly immunotherapy injections for first 9-12 months and then biweekly to monthly injections for 3-5 years.   Allergy immunotherapy may also help with her oral allergy syndrome in the long-term.  Patient will let us know if ready to start but was concerned about the time commitment involved.  Allergic conjunctivitis of both eyes  See assessment and plan as above for allergic rhinitis.  Return in about 4 months (around 12/26/2019).  Meds ordered this encounter  Medications   EPINEPHrine (AUVI-Q) 0.3 mg/0.3 mL IJ SOAJ injection    Sig: Use as directed for severe  allergic reactions    Dispense:  2 each    Refill:  1   fluticasone (FLONASE) 50 MCG/ACT nasal spray    Sig: 1-2 sprays per nostril once daily if needed for stuffy nose.    Dispense:  16 g    Refill:  5   Other allergy screening: Asthma: no Rhino conjunctivitis: yes Food allergy: yes Medication allergy: yes  Sulfa antibiotics - swelling and itching.  Hymenoptera allergy: no Urticaria: no Eczema:no History of recurrent infections suggestive of immunodeficency: no  Diagnostics: Skin Testing: Environmental allergy panel and select foods. Positive test to: grass, ragweed, weed, tree, mold, dust mites, cat, dog. Borderline positive to peanuts, tree nuts, onion, carrots, celery and cucumber. Results discussed with patient/family. Airborne Adult Perc - 08/25/19 0943    Time Antigen Placed  1610    Allergen Manufacturer  Waynette Buttery    Location  Back    Number of Test  59    1. Control-Buffer 50% Glycerol  Negative    2. Control-Histamine 1 mg/ml  2+    3. Albumin saline  Negative    4. Bahia  3+    5. [place]  3+    6. Johnson  3+    7. [place] Blue  3+    8. Meadow Fescue  2+    9. Perennial Rye  3+    10. Sweet Vernal  3+    11. Timothy  4+    12. Cocklebur  2+    13. Burweed Marshelder  2+    14. Ragweed, short  4+    15. Ragweed, Giant  3+  16. Plantain,  English  2+    17. Lamb's Quarters  2+    18. Sheep Sorrell  3+    19. Rough Pigweed  2+    20. [place], Rough  --   +/-   21. Mugwort, Common  2+    22. Ash mix  2+    23. Birch mix  4+    24. Beech American  3+    25. Box, Elder  2+    26. Cedar, red  2+    27. Cottonwood, Eastern  3+    28. Elm mix  3+    29. Hickory mix  3+    30. Maple mix  --   +/-   31. Oak, Guinea[place] mix  4+    32. Pecan Pollen  3+    33. Pine mix  Negative    34. Sycamore Eastern  4+    35. Walnut, Black Pollen  2+    36. Alternaria alternata  2+    37. Cladosporium Herbarum  Negative    38. Aspergillus mix  Negative    39.  Penicillium mix  Negative    40. Bipolaris sorokiniana (Helminthosporium)  Negative    41. Drechslera spicifera (Curvularia)  Negative    42. Mucor plumbeus  Negative    43. Fusarium moniliforme  Negative    44. Aureobasidium pullulans (pullulara)  Negative    45. Rhizopus oryzae  Negative    46. Botrytis cinera  Negative    47. Epicoccum nigrum  Negative    48. Phoma betae  Negative    49. Candida Albicans  Negative    50. Trichophyton mentagrophytes  Negative    51. Mite, D Farinae  5,000 AU/ml  3+    52. Mite, D Pteronyssinus  5,000 AU/ml  3+    53. Cat Hair 10,000 BAU/ml  2+    54.  Dog Epithelia  3+    55. Mixed Feathers  Negative    56. Horse Epithelia  Negative    57. Cockroach, German  Negative    58. Mouse  Negative    59. Tobacco Leaf  Negative     Intradermal - 08/25/19 1018    Time Antigen Placed  1018    Allergen Manufacturer  Greer    Location  Arm    Number of Test  5    Control  Negative    Mold 2  Negative    Mold 3  Negative    Mold 4  2+    Cockroach  Negative     Food Adult Perc - 08/25/19 0900    Time Antigen Placed  1610    Allergen Manufacturer  Waynette Buttery    Location  Back    Number of allergen test  25    1. Peanut  2+    10. Cashew  Negative    11. Pecan Food  --   +/-   12. Walnut Food  --   +/-   13. Almond  Negative    14. Hazelnut  4+    15. [place] nut  Negative    16. Coconut  Negative    17. Pistachio  Negative    42. Tomato  Negative    48. Avocado  Negative    49. Onion  --   +/-   50. Cabbage  Negative    51. Carrots  --   +/-   52. Celery  --   +/-  53. Corn  Negative    54. Cucumber  --   +/-   55. Grape (White seedless)  Negative    56. Orange   Negative    57. Banana  Negative    58. Apple  Negative    59. Peach  Negative    60. Strawberry  Negative    61. Cantaloupe  Negative    62. Watermelon  Negative       Past Medical History: Patient Active Problem List   Diagnosis Date Noted   Adverse food reaction  08/25/2019   Pollen-food allergy 08/25/2019   Other allergic rhinitis 08/25/2019   Allergic conjunctivitis of both eyes 08/25/2019   Past Medical History:  Diagnosis Date   DVT (deep venous thrombosis) (HCC)    High cholesterol    Past Surgical History: Past Surgical History:  Procedure Laterality Date   ABDOMINAL HYSTERECTOMY     Medication List:  Current Outpatient Medications  Medication Sig Dispense Refill   EPINEPHrine (AUVI-Q) 0.3 mg/0.3 mL IJ SOAJ injection Use as directed for severe allergic reactions 2 each 1   fluticasone (FLONASE) 50 MCG/ACT nasal spray 1-2 sprays per nostril once daily if needed for stuffy nose. 16 g 5   Vitamin D, Ergocalciferol, (DRISDOL) 1.25 MG (50000 UT) CAPS capsule Take by mouth.     No current facility-administered medications for this visit.    Allergies: Allergies  Allergen Reactions   Sulfa Antibiotics Swelling   Sulfur Swelling   Social History: Social History   Socioeconomic History   Marital status: Single    Spouse name: Not on file   Number of children: Not on file   Years of education: Not on file   Highest education level: Not on file  Occupational History   Not on file  Social Needs   Financial resource strain: Not on file   Food insecurity    Worry: Not on file    Inability: Not on file   Transportation needs    Medical: Not on file    Non-medical: Not on file  Tobacco Use   Smoking status: Current Some Day Smoker    Types: Cigars   Smokeless tobacco: Never Used  Substance and Sexual Activity   Alcohol use: Yes    Comment: occ   Drug use: No   Sexual activity: Not on file  Lifestyle   Physical activity    Days per week: Not on file    Minutes per session: Not on file   Stress: Not on file  Relationships   Social connections    Talks on phone: Not on file    Gets together: Not on file    Attends religious service: Not on file    Active member of club or organization: Not on  file    Attends meetings of clubs or organizations: Not on file    Relationship status: Not on file  Other Topics Concern   Not on file  Social History Narrative   Not on file   Lives in a 53 year old home. Smoking: occasionally smoke cigars Occupation: Pharmacist, communityadministrative  Environmental HistorySurveyor, minerals: Water Damage/mildew in the house: no Engineer, civil (consulting)Carpet in the family room: yes Carpet in the bedroom: no Heating: gas Cooling: central Pet: yes 1 dog x 11 yrs  Family History: Family History  Problem Relation Age of Onset   Cancer Mother    Lung disease Mother    Asthma Mother    Asthma Brother    Heart disease Maternal  Grandfather    Allergic rhinitis Neg Hx    Angioedema Neg Hx    Immunodeficiency Neg Hx    Eczema Neg Hx    Urticaria Neg Hx    Review of Systems  Constitutional: Negative for appetite change, chills, fever and unexpected weight change.  HENT: Negative for congestion and rhinorrhea.   Eyes: Negative for itching.  Respiratory: Negative for cough, chest tightness, shortness of breath and wheezing.   Cardiovascular: Negative for chest pain.  Gastrointestinal: Negative for abdominal pain.  Genitourinary: Negative for difficulty urinating.  Skin: Negative for rash.  Allergic/Immunologic: Positive for environmental allergies and food allergies.  Neurological: Negative for headaches.   Objective: BP 102/64 (BP Location: Right Arm, Patient Position: Sitting, Cuff Size: Normal)    Pulse 76    Temp 98.2 F (36.8 C) (Oral)    Resp 20    Ht 5\' 1"  (1.549 m)    Wt 192 lb 14.4 oz (87.5 kg)    SpO2 98%    BMI 36.45 kg/m  Body mass index is 36.45 kg/m. Physical Exam  Constitutional: She is oriented to person, place, and time. She appears well-developed and well-nourished.  HENT:  Head: Normocephalic and atraumatic.  Right Ear: External ear normal.  Nose: Nose normal.  Mouth/Throat: Oropharynx is clear and moist.  Bilateral cerumen impaction  Eyes: Conjunctivae and EOM  are normal.  Neck: Neck supple.  Cardiovascular: Normal rate, regular rhythm and normal heart sounds. Exam reveals no gallop and no friction rub.  No murmur heard. Pulmonary/Chest: Effort normal and breath sounds normal. She has no wheezes. She has no rales.  Abdominal: Soft.  Neurological: She is alert and oriented to person, place, and time.  Skin: Skin is warm. No rash noted.  Psychiatric: She has a normal mood and affect. Her behavior is normal.  Nursing note and vitals reviewed.  The plan was reviewed with the patient/family, and all questions/concerned were addressed.  It was my pleasure to see Sandra Thornton today and participate in her care. Please feel free to contact me with any questions or concerns.  Sincerely,  Kenney Houseman, DO Allergy & Immunology  [facility] office: [phone] [facility] office: [phone] [place] office: [phone]

## 2019-11-07 ENCOUNTER — Ambulatory Visit: Payer: BC Managed Care – PPO | Attending: Internal Medicine

## 2019-11-07 DIAGNOSIS — Z20822 Contact with and (suspected) exposure to covid-19: Secondary | ICD-10-CM

## 2019-11-07 DIAGNOSIS — Z20828 Contact with and (suspected) exposure to other viral communicable diseases: Secondary | ICD-10-CM | POA: Insufficient documentation

## 2019-11-08 LAB — NOVEL CORONAVIRUS, NAA: SARS-CoV-2, NAA: NOT DETECTED

## 2019-11-16 ENCOUNTER — Other Ambulatory Visit (HOSPITAL_COMMUNITY): Payer: Self-pay | Admitting: Endocrinology

## 2019-11-16 DIAGNOSIS — E0590 Thyrotoxicosis, unspecified without thyrotoxic crisis or storm: Secondary | ICD-10-CM

## 2019-11-22 ENCOUNTER — Ambulatory Visit: Payer: BC Managed Care – PPO | Attending: Internal Medicine

## 2019-11-22 DIAGNOSIS — Z20822 Contact with and (suspected) exposure to covid-19: Secondary | ICD-10-CM

## 2019-11-23 LAB — NOVEL CORONAVIRUS, NAA: SARS-CoV-2, NAA: NOT DETECTED

## 2019-11-27 ENCOUNTER — Other Ambulatory Visit (HOSPITAL_COMMUNITY): Payer: BC Managed Care – PPO

## 2019-11-27 ENCOUNTER — Ambulatory Visit (HOSPITAL_COMMUNITY): Payer: BC Managed Care – PPO

## 2019-11-28 ENCOUNTER — Other Ambulatory Visit (HOSPITAL_COMMUNITY): Payer: BC Managed Care – PPO

## 2019-12-06 ENCOUNTER — Encounter (HOSPITAL_COMMUNITY): Payer: BC Managed Care – PPO

## 2019-12-07 ENCOUNTER — Encounter (HOSPITAL_COMMUNITY): Payer: BC Managed Care – PPO

## 2019-12-11 ENCOUNTER — Encounter (HOSPITAL_COMMUNITY)
Admission: RE | Admit: 2019-12-11 | Discharge: 2019-12-11 | Disposition: A | Discharge status: Home / Self Care | Payer: BC Managed Care – PPO | Source: Ambulatory Visit | Attending: Endocrinology | Admitting: Endocrinology

## 2019-12-11 ENCOUNTER — Other Ambulatory Visit: Payer: Self-pay

## 2019-12-11 DIAGNOSIS — E0590 Thyrotoxicosis, unspecified without thyrotoxic crisis or storm: Secondary | ICD-10-CM | POA: Insufficient documentation

## 2019-12-11 MED ORDER — SODIUM IODIDE I-123 7.4 MBQ CAPS
411.7000 | ORAL_CAPSULE | Freq: Once | ORAL | Status: AC
  Administered 2019-12-11: 411.7 via ORAL

## 2019-12-12 ENCOUNTER — Encounter (HOSPITAL_COMMUNITY)
Admission: RE | Admit: 2019-12-12 | Discharge: 2019-12-12 | Disposition: A | Discharge status: Home / Self Care | Payer: BC Managed Care – PPO | Source: Ambulatory Visit | Attending: Endocrinology | Admitting: Endocrinology

## 2019-12-28 DIAGNOSIS — H1010 Acute atopic conjunctivitis, unspecified eye: Secondary | ICD-10-CM | POA: Insufficient documentation

## 2019-12-28 DIAGNOSIS — J302 Other seasonal allergic rhinitis: Secondary | ICD-10-CM | POA: Insufficient documentation

## 2019-12-28 NOTE — Progress Notes (Deleted)
Follow Up Note  RE: Sandra Thornton MRN: [id number] DOB: 17-Feb-1966 Date of Office Visit: 12/29/2019  Referring provider: Donald Prose, MD Primary care provider: Donald Prose, MD  Chief Complaint: No chief complaint on file.  History of Present Illness: I had the pleasure of seeing Sandra Thornton for a follow up visit at the [facility] on 12/28/2019. She is a 54 y.o. female, who is being followed for oral allergy syndrome, allergic rhino conjunctivitis . Her previous allergy office visit was on 08/25/2019 with Dr. Maudie Mercury. Today is a regular follow up visit.  Pollen-food allergy Perioral pruritus with episodes of nausea and vomiting with fresh fruits and vegetables since a young child.  Tolerates processed/cooked fruits and vegetables with no issues.  Some history of reflux.  Skin testing around age 46 showed multiple positives per patient report.  She never had an epinephrine device and no previous evaluation ER evaluation for this.   Today's skin testing was borderline positive to peanuts, tree nuts, onion, carrots, celery and cucumber.  Continue to avoid foods that bother you - fresh fruits and fresh vegetables, peanuts, tree nuts.   I have prescribed epinephrine injectable and demonstrated proper use. For mild symptoms you can take over the counter antihistamines such as Benadryl and monitor symptoms closely. If symptoms worsen or if you have severe symptoms including breathing issues, throat closure, significant swelling, whole body hives, severe diarrhea and vomiting, lightheadedness then inject epinephrine and seek immediate medical care afterwards.  Food action plan given.  Discussed that her food triggered oral and throat symptoms are likely caused by oral food allergy syndrome (OFAS). This is caused by cross reactivity of pollen with fresh fruits and vegetables, and nuts. Symptoms are usually localized in the form of itching and burning in mouth and throat. Very  rarely it can progress to more severe symptoms. Eating foods in cooked or processed forms usually minimizes symptoms. I recommended avoidance of eating the problem foods, especially during the peak season(s). Sometimes, OFAS can induce severe throat swelling or even a systemic reaction; with such instance, I advised them to report to a local ER. A list of common pollens and food cross-reactivities was provided to the patient.   Adverse food reaction  See assessment as plan as above for oral allergy syndrome.  Other allergic rhinitis Rhinoconjunctivitis symptoms mainly from spring through fall for 40+ years.  Using Flonase, over-the-counter antihistamines with some benefit.  Skin testing as a child showed multiple positives per patient report.  No previous allergy immunotherapy.  Today's skin testing showed: Positive to grass, ragweed, weed, tree, mold, dust mites, cat, dog.  Start environmental control measures.  May use over the counter antihistamines such as Zyrtec (cetirizine), Claritin (loratadine), Allegra (fexofenadine), or Xyzal (levocetirizine) daily as needed.  May use Flonase 1-2 sprays per nostril daily as needed for nasal congestion.  Declines eyedrops at this time.  Had a detailed discussion with patient/family that clinical history is suggestive of allergic rhinitis, and may benefit from allergy immunotherapy (AIT). Discussed in detail regarding the dosing, schedule, side effects (mild to moderate local allergic reaction and rarely systemic allergic reactions including anaphylaxis), and benefits (significant improvement in nasal symptoms, seasonal flares of asthma) of immunotherapy with the patient. There is significant time commitment involved with allergy shots, which includes weekly immunotherapy injections for first 9-12 months and then biweekly to monthly injections for 3-5 years.   Allergy immunotherapy may also help with her oral allergy syndrome in the  long-term.  Patient will let us know if ready to start but was concerned about the time commitment involved.  Allergic conjunctivitis of both eyes  See assessment and plan as above for allergic rhinitis.  Return in about 4 months (around 12/26/2019).   Assessment and Plan: Sandra Thornton is a 54 y.o. female with: No problem-specific Assessment & Plan notes found for this encounter.  No follow-ups on file.  No orders of the defined types were placed in this encounter.  Lab Orders  No laboratory test(s) ordered today    Diagnostics: Spirometry:  Tracings reviewed. Her effort: {Blank single:19197::"Good reproducible efforts.","It was hard to get consistent efforts and there is a question as to whether this reflects a maximal maneuver.","Poor effort, data can not be interpreted."} FVC: ***L FEV1: ***L, ***% predicted FEV1/FVC ratio: ***% Interpretation: {Blank single:19197::"Spirometry consistent with mild obstructive disease","Spirometry consistent with moderate obstructive disease","Spirometry consistent with severe obstructive disease","Spirometry consistent with possible restrictive disease","Spirometry consistent with mixed obstructive and restrictive disease","Spirometry uninterpretable due to technique","Spirometry consistent with normal pattern","No overt abnormalities noted given today's efforts"}.  Please see scanned spirometry results for details.  Skin Testing: {Blank single:19197::"Select foods","Environmental allergy panel","Environmental allergy panel and select foods","Food allergy panel","None","Deferred due to recent antihistamines use"}. Positive test to: ***. Negative test to: ***.  Results discussed with patient/family.   Medication List:  Current Outpatient Medications  Medication Sig Dispense Refill  . EPINEPHrine (AUVI-Q) 0.3 mg/0.3 mL IJ SOAJ injection Use as directed for severe allergic reactions 2 each 1  . fluticasone (FLONASE) 50 MCG/ACT nasal spray 1-2  sprays per nostril once daily if needed for stuffy nose. 16 g 5  . Vitamin D, Ergocalciferol, (DRISDOL) 1.25 MG (50000 UT) CAPS capsule Take by mouth.     No current facility-administered medications for this visit.   Allergies: Allergies  Allergen Reactions  . Sulfa Antibiotics Swelling  . Sulfur Swelling   I reviewed her past medical history, social history, family history, and environmental history and no significant changes have been reported from her previous visit.  Review of Systems  Constitutional: Negative for appetite change, chills, fever and unexpected weight change.  HENT: Negative for congestion and rhinorrhea.   Eyes: Negative for itching.  Respiratory: Negative for cough, chest tightness, shortness of breath and wheezing.   Cardiovascular: Negative for chest pain.  Gastrointestinal: Negative for abdominal pain.  Genitourinary: Negative for difficulty urinating.  Skin: Negative for rash.  Allergic/Immunologic: Positive for environmental allergies and food allergies.  Neurological: Negative for headaches.   Objective: There were no vitals taken for this visit. There is no height or weight on file to calculate BMI. Physical Exam  Constitutional: She is oriented to person, place, and time. She appears well-developed and well-nourished.  HENT:  Head: Normocephalic and atraumatic.  Right Ear: External ear normal.  Nose: Nose normal.  Mouth/Throat: Oropharynx is clear and moist.  Bilateral cerumen impaction  Eyes: Conjunctivae and EOM are normal.  Cardiovascular: Normal rate, regular rhythm and normal heart sounds. Exam reveals no gallop and no friction rub.  No murmur heard. Pulmonary/Chest: Effort normal and breath sounds normal. She has no wheezes. She has no rales.  Abdominal: Soft.  Musculoskeletal:     Cervical back: Neck supple.  Neurological: She is alert and oriented to person, place, and time.  Skin: Skin is warm. No rash noted.  Psychiatric: She has a  normal mood and affect. Her behavior is normal.  Nursing note and vitals reviewed.  Previous notes and tests were reviewed. The plan  was reviewed with the patient/family, and all questions/concerned were addressed.  It was my pleasure to see Falon today and participate in her care. Please feel free to contact me with any questions or concerns.  Sincerely,  Wyline Mood, DO Allergy & Immunology  [facility] office: [phone] [facility] office: [phone] [place] office: [phone]

## 2019-12-29 ENCOUNTER — Ambulatory Visit: Payer: Self-pay | Admitting: Allergy

## 2020-02-05 ENCOUNTER — Ambulatory Visit: Payer: BC Managed Care – PPO | Attending: Internal Medicine

## 2020-02-05 DIAGNOSIS — Z20822 Contact with and (suspected) exposure to covid-19: Secondary | ICD-10-CM

## 2020-02-06 ENCOUNTER — Other Ambulatory Visit: Payer: Self-pay | Admitting: Family Medicine

## 2020-02-06 DIAGNOSIS — Z1231 Encounter for screening mammogram for malignant neoplasm of breast: Secondary | ICD-10-CM

## 2020-02-06 LAB — NOVEL CORONAVIRUS, NAA: SARS-CoV-2, NAA: NOT DETECTED

## 2020-02-06 LAB — SARS-COV-2, NAA 2 DAY TAT

## 2020-02-16 ENCOUNTER — Ambulatory Visit: Payer: BC Managed Care – PPO

## 2020-02-23 ENCOUNTER — Other Ambulatory Visit: Payer: Self-pay

## 2020-02-23 ENCOUNTER — Ambulatory Visit
Admission: RE | Admit: 2020-02-23 | Discharge: 2020-02-23 | Disposition: A | Discharge status: Home / Self Care | Payer: BC Managed Care – PPO | Source: Ambulatory Visit | Attending: Family Medicine | Admitting: Family Medicine

## 2020-02-23 DIAGNOSIS — Z1231 Encounter for screening mammogram for malignant neoplasm of breast: Secondary | ICD-10-CM

## 2020-02-26 ENCOUNTER — Other Ambulatory Visit: Payer: Self-pay | Admitting: Family Medicine

## 2020-02-26 DIAGNOSIS — R928 Other abnormal and inconclusive findings on diagnostic imaging of breast: Secondary | ICD-10-CM

## 2020-03-01 ENCOUNTER — Ambulatory Visit
Admission: RE | Admit: 2020-03-01 | Discharge: 2020-03-01 | Disposition: A | Discharge status: Home / Self Care | Payer: BC Managed Care – PPO | Source: Ambulatory Visit | Attending: Family Medicine | Admitting: Family Medicine

## 2020-03-01 ENCOUNTER — Other Ambulatory Visit: Payer: Self-pay | Admitting: Family Medicine

## 2020-03-01 ENCOUNTER — Other Ambulatory Visit: Payer: Self-pay

## 2020-03-01 DIAGNOSIS — R928 Other abnormal and inconclusive findings on diagnostic imaging of breast: Secondary | ICD-10-CM

## 2020-03-01 DIAGNOSIS — N6489 Other specified disorders of breast: Secondary | ICD-10-CM

## 2020-12-22 ENCOUNTER — Encounter (INDEPENDENT_AMBULATORY_CARE_PROVIDER_SITE_OTHER): Payer: Self-pay

## 2021-01-14 ENCOUNTER — Other Ambulatory Visit: Payer: Self-pay

## 2021-01-14 ENCOUNTER — Ambulatory Visit (INDEPENDENT_AMBULATORY_CARE_PROVIDER_SITE_OTHER): Payer: BC Managed Care – PPO | Admitting: Family Medicine

## 2021-01-14 ENCOUNTER — Encounter (INDEPENDENT_AMBULATORY_CARE_PROVIDER_SITE_OTHER): Payer: Self-pay | Admitting: Family Medicine

## 2021-01-14 VITALS — BP 111/74 | HR 71 | Temp 98.7°F | Ht 61.0 in | Wt 189.0 lb

## 2021-01-14 DIAGNOSIS — E559 Vitamin D deficiency, unspecified: Secondary | ICD-10-CM | POA: Diagnosis not present

## 2021-01-14 DIAGNOSIS — Z9189 Other specified personal risk factors, not elsewhere classified: Secondary | ICD-10-CM

## 2021-01-14 DIAGNOSIS — E66812 Obesity, class 2: Secondary | ICD-10-CM

## 2021-01-14 DIAGNOSIS — R5383 Other fatigue: Secondary | ICD-10-CM

## 2021-01-14 DIAGNOSIS — E7849 Other hyperlipidemia: Secondary | ICD-10-CM

## 2021-01-14 DIAGNOSIS — F32A Depression, unspecified: Secondary | ICD-10-CM

## 2021-01-14 DIAGNOSIS — R0602 Shortness of breath: Secondary | ICD-10-CM

## 2021-01-14 DIAGNOSIS — Z0289 Encounter for other administrative examinations: Secondary | ICD-10-CM

## 2021-01-14 DIAGNOSIS — E0590 Thyrotoxicosis, unspecified without thyrotoxic crisis or storm: Secondary | ICD-10-CM | POA: Diagnosis not present

## 2021-01-14 DIAGNOSIS — E538 Deficiency of other specified B group vitamins: Secondary | ICD-10-CM

## 2021-01-14 DIAGNOSIS — Z6835 Body mass index (BMI) 35.0-35.9, adult: Secondary | ICD-10-CM

## 2021-01-15 LAB — CBC WITH DIFFERENTIAL/PLATELET
Basophils Absolute: 0 10*3/uL (ref 0.0–0.2)
Basos: 1 %
EOS (ABSOLUTE): 0.1 10*3/uL (ref 0.0–0.4)
Eos: 2 %
Hematocrit: 46.2 % (ref 34.0–46.6)
Hemoglobin: 14.9 g/dL (ref 11.1–15.9)
Immature Grans (Abs): 0 10*3/uL (ref 0.0–0.1)
Immature Granulocytes: 0 %
Lymphocytes Absolute: 1.1 10*3/uL (ref 0.7–3.1)
Lymphs: 19 %
MCH: 28.1 pg (ref 26.6–33.0)
MCHC: 32.3 g/dL (ref 31.5–35.7)
MCV: 87 fL (ref 79–97)
Monocytes Absolute: 0.5 10*3/uL (ref 0.1–0.9)
Monocytes: 9 %
Neutrophils Absolute: 4 10*3/uL (ref 1.4–7.0)
Neutrophils: 69 %
Platelets: 242 10*3/uL (ref 150–450)
RBC: 5.31 x10E6/uL — ABNORMAL HIGH (ref 3.77–5.28)
RDW: 12 % (ref 11.7–15.4)
WBC: 5.8 10*3/uL (ref 3.4–10.8)

## 2021-01-15 LAB — COMPREHENSIVE METABOLIC PANEL
ALT: 12 IU/L (ref 0–32)
AST: 12 IU/L (ref 0–40)
Albumin/Globulin Ratio: 1.5 (ref 1.2–2.2)
Albumin: 4.4 g/dL (ref 3.8–4.9)
Alkaline Phosphatase: 98 IU/L (ref 44–121)
BUN/Creatinine Ratio: 12 (ref 9–23)
BUN: 12 mg/dL (ref 6–24)
Bilirubin Total: 0.7 mg/dL (ref 0.0–1.2)
CO2: 22 mmol/L (ref 20–29)
Calcium: 9.3 mg/dL (ref 8.7–10.2)
Chloride: 99 mmol/L (ref 96–106)
Creatinine, Ser: 1 mg/dL (ref 0.57–1.00)
Globulin, Total: 2.9 g/dL (ref 1.5–4.5)
Glucose: 97 mg/dL (ref 65–99)
Potassium: 4.1 mmol/L (ref 3.5–5.2)
Sodium: 139 mmol/L (ref 134–144)
Total Protein: 7.3 g/dL (ref 6.0–8.5)
eGFR: 67 mL/min/{1.73_m2} (ref >59)

## 2021-01-15 LAB — LIPID PANEL
Chol/HDL Ratio: 3 ratio (ref 0.0–4.4)
Cholesterol, Total: 252 mg/dL — ABNORMAL HIGH (ref 100–199)
HDL: 85 mg/dL (ref >39)
LDL Chol Calc (NIH): 157 mg/dL — ABNORMAL HIGH (ref 0–99)
Triglycerides: 60 mg/dL (ref 0–149)
VLDL Cholesterol Cal: 10 mg/dL (ref 5–40)

## 2021-01-15 LAB — TSH: TSH: 0.8 u[IU]/mL (ref 0.450–4.500)

## 2021-01-15 LAB — FOLATE: Folate: 18.8 ng/mL (ref >3.0)

## 2021-01-15 LAB — T4, FREE: Free T4: 1.55 ng/dL (ref 0.82–1.77)

## 2021-01-15 LAB — VITAMIN D 25 HYDROXY (VIT D DEFICIENCY, FRACTURES): Vit D, 25-Hydroxy: 16.3 ng/mL — ABNORMAL LOW (ref 30.0–100.0)

## 2021-01-15 LAB — INSULIN, RANDOM: INSULIN: 8.3 u[IU]/mL (ref 2.6–24.9)

## 2021-01-15 LAB — HEMOGLOBIN A1C
Est. average glucose Bld gHb Est-mCnc: 123 mg/dL
Hgb A1c MFr Bld: 5.9 % — ABNORMAL HIGH (ref 4.8–5.6)

## 2021-01-15 LAB — T3: T3, Total: 117 ng/dL (ref 71–180)

## 2021-01-15 LAB — VITAMIN B12: Vitamin B-12: 611 pg/mL (ref 232–1245)

## 2021-01-15 NOTE — Progress Notes (Signed)
Dear Dr. Estanislado Pandy,   Thank you for referring Sandra Thornton to our clinic. The following note includes my evaluation and treatment recommendations.  Chief Complaint:   OBESITY Sandra Thornton (MR# [id number]) is a 55 y.o. female who presents for evaluation and treatment of obesity and related comorbidities. Current BMI is Body mass index is 35.71 kg/m. Sandra Thornton has been struggling with her weight for many years and has been unsuccessful in either losing weight, maintaining weight loss, or reaching her healthy weight goal.  Sandra Thornton is currently in the action stage of change and ready to dedicate time achieving and maintaining a healthier weight. Sandra Thornton is interested in becoming our patient and working on intensive lifestyle modifications including (but not limited to) diet and exercise for weight loss.  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  Lives with her partner, Sandra Thornton.  No children.  Minimal exercise.  Craves pasta, breads, sweets, and read meat, especially in the afternoons.  Snacks on chips and chocolate.  Occasionally skips lunch.  Sandra Thornton's habits were reviewed today and are as follows: Her family eats meals together, she thinks her family will eat healthier with her, her desired weight loss is 45 pounds, she started gaining weight 14 years ago, her heaviest weight ever was her current weight, she craves sweets, breads, pasta, and red meat, she skips lunch frequently, she is frequently drinking liquids with calories, she frequently makes poor food choices, she frequently eats larger portions than normal and she struggles with emotional eating.  Depression Screen Sandra Thornton's Food and Mood (modified PHQ-9) score was 11.  Depression screen PHQ 2/9 01/14/2021  Decreased Interest 2  Down, Depressed, Hopeless 1  PHQ - 2 Score 3  Altered sleeping 1  Tired, decreased energy 2  Change in appetite 3  Feeling bad or failure about yourself  1  Trouble concentrating 2  Moving  slowly or fidgety/restless 0  Suicidal thoughts 0  PHQ-9 Score 12  Difficult doing work/chores Somewhat difficult   Assessment/Plan:   1. Other fatigue Ji admits to daytime somnolence and reports waking up still tired. Patent has a history of symptoms of daytime fatigue, morning fatigue, morning headache and snoring. [name] generally gets 5 hours of sleep per night, and states that she has poor quality sleep. Snoring is present. Apneic episodes are present. Epworth Sleepiness Score is 12.  Coretha does feel that her weight is causing her energy to be lower than it should be. Fatigue may be related to obesity, depression or many other causes. Labs will be ordered, and in the meanwhile, [name] will focus on self care including making healthy food choices, increasing physical activity and focusing on stress reduction.  Will check EKG and labs today.  - EKG 12-Lead - CBC with Differential/Platelet - Comprehensive metabolic panel - Hemoglobin A1c - Insulin, random  2. SOBOE (shortness of breath on exertion) Sandra Thornton notes increasing shortness of breath with exercising and seems to be worsening over time with weight gain. She notes getting out of breath sooner with activity than she used to. This has gotten worse recently. Sandra Thornton denies shortness of breath at rest or orthopnea.  Sandra Thornton does feel that she gets out of breath more easily that she used to when she exercises. Sandra Thornton's shortness of breath appears to be obesity related and exercise induced. She has agreed to work on weight loss and gradually increase exercise to treat her exercise induced shortness of breath. Will continue to monitor closely.  IC and labs  performed today.  - CBC with Differential/Platelet  3. Other hyperlipidemia Course: Not at goal. Lipid-lowering medications: None.  [name]'s maternal grandmother has hyperlipidemia.  [name] has never been on cholesterol medications.  Plan: Dietary changes: Increase soluble fiber, decrease  simple carbohydrates, decrease saturated fat. Exercise changes: Moderate to vigorous-intensity aerobic activity 150 minutes per week or as tolerated. We will continue to monitor along with PCP/specialists as it pertains to her weight loss journey.  Will check FLP today.  - Lipid panel  4. Hyperthyroidism Course: Controlled. Medication: None.  She has seen Endocrinology in the past.  No medication or treatment rendered.  She is currently asymptomatic.  Plan: Patient was instructed not to take MVM or iron within 4 hours of taking thyroid medications.  We will continue to monitor alongside Endocrinology/PCP as it relates to her weight loss journey.   - T3 - T4, free - TSH  5. Deficiency of vitamin B12 Sandra Thornton is not currently taking a vitamin B12 supplement.  Plan:  Will check her vitamin B12 level today.  - Vitamin B12 - Folate  6. Vitamin D deficiency Optimal goal > 50 ng/dL.   Sandra Thornton is not taking a vitamin D supplement.    Plan:  Will check vitamin D level today, as per below.  - VITAMIN D 25 Hydroxy (Vit-D Deficiency, Fractures)  7. Depression, with emotional eating Not at goal. Medication: None.  [name] says she stress eats.  Denies SI or feeling depressed, and has never thought she needed medication or other intervention in the past.  PHQ-9 is 12.  Plan:  Behavior modification techniques were discussed today to help deal with emotional/non-hunger eating behaviors.  Will check T3, free T4, and TSH today, as per below.  - T3 - T4, free - TSH  8. At risk for impaired metabolic function Due to Sandra Thornton's current state of health and medical condition(s), she is at a significantly higher risk for impaired metabolic function.  This places the patient at a much greater risk to subsequently develop cardiopulmonary conditions that can negatively affect the patient's quality of life.  At least 15 minutes was spent on counseling Lester about these concerns today, and I stressed the importance  of reversing these risks factors.  The initial goal is to lose at least 5-10% of starting weight to help reduce risk factors.  Counseling:  Intensive lifestyle modifications discussed with Sherae as the most appropriate first line treatment.  she will continue to work on diet, exercise, and weight loss efforts.  We will continue to reassess these conditions on a fairly regular basis in an attempt to decrease the patient's overall morbidity and mortality.  9. Class 2 severe obesity with serious comorbidity and body mass index (BMI) of 35.0 to 35.9 in adult, unspecified obesity type (HCC)  David is currently in the action stage of change and her goal is to continue with weight loss efforts. I recommend Kimanh begin the structured treatment plan as follows:  She has agreed to the Category 1 Plan.  Exercise goals: As is.   Behavioral modification strategies: decreasing simple carbohydrates, meal planning and cooking strategies and planning for success.  She was informed of the importance of frequent follow-up visits to maximize her success with intensive lifestyle modifications for her multiple health conditions. She was informed we would discuss her lab results at her next visit unless there is a critical issue that needs to be addressed sooner. Abigail agreed to keep her next visit at the agreed upon time to  discuss these results.  Objective:   Blood pressure 111/74, pulse 71, temperature 98.7 F (37.1 C), height 5\' 1"  (1.549 m), weight 189 lb (85.7 kg), SpO2 96 %. Body mass index is 35.71 kg/m.  EKG: Normal sinus rhythm, rate 74 bpm.  Indirect Calorimeter completed today shows a VO2 of 220 and a REE of 1534.  Her calculated basal metabolic rate is thus her basal metabolic rate is better than expected.  General: Cooperative, alert, well developed, in no acute distress. HEENT: Conjunctivae and lids unremarkable. Cardiovascular: Regular rhythm.  Lungs: Normal work of breathing. Neurologic:  No focal deficits.   Attestation Statements:   This is the patient's first visit at [facility]. The patient's NEW PATIENT PACKET was reviewed at length. Included in the packet: current and past health history, medications, allergies, ROS, gynecologic history (women only), surgical history, family history, social history, weight history, weight loss surgery history (for those that have had weight loss surgery), nutritional evaluation, mood and food questionnaire, PHQ9, Epworth questionnaire, sleep habits questionnaire, patient life and health improvement goals questionnaire. These will all be scanned into the patient's chart under media.   During the visit, I independently reviewed the patient's EKG, bioimpedance scale results, and indirect calorimeter results. I used this information to tailor a meal plan for the patient that will help her to lose weight and will improve her obesity-related conditions going forward. I performed a medically necessary appropriate examination and/or evaluation. I discussed the assessment and treatment plan with the patient. The patient was provided an opportunity to ask questions and all were answered. The patient agreed with the plan and demonstrated an understanding of the instructions. Labs were ordered at this visit and will be reviewed at the next visit unless more critical results need to be addressed immediately. Clinical information was updated and documented in the EMR.   I, [id number], CMA, am acting as Insurance claims handler for Energy manager, DO.  I have reviewed the above documentation for accuracy and completeness, and I agree with the above. [employer], D.O.  The 21st Century Cures Act was signed into law in 2016 which includes the topic of electronic health records.  This provides immediate access to information in MyChart.  This includes consultation notes, operative notes, office notes, lab results and pathology reports.  If you have  any questions about what you read please let 2017 know at your next visit so we can discuss your concerns and take corrective action if need be.  We are right here with you.

## 2021-01-28 ENCOUNTER — Ambulatory Visit (INDEPENDENT_AMBULATORY_CARE_PROVIDER_SITE_OTHER): Payer: BC Managed Care – PPO | Admitting: Family Medicine

## 2021-01-28 ENCOUNTER — Other Ambulatory Visit: Payer: Self-pay

## 2021-01-28 ENCOUNTER — Encounter (INDEPENDENT_AMBULATORY_CARE_PROVIDER_SITE_OTHER): Payer: Self-pay | Admitting: Family Medicine

## 2021-01-28 VITALS — BP 109/68 | HR 75 | Temp 98.2°F | Ht 61.0 in | Wt 189.0 lb

## 2021-01-28 DIAGNOSIS — Z9189 Other specified personal risk factors, not elsewhere classified: Secondary | ICD-10-CM | POA: Diagnosis not present

## 2021-01-28 DIAGNOSIS — E538 Deficiency of other specified B group vitamins: Secondary | ICD-10-CM

## 2021-01-28 DIAGNOSIS — Z6835 Body mass index (BMI) 35.0-35.9, adult: Secondary | ICD-10-CM

## 2021-01-28 DIAGNOSIS — E611 Iron deficiency: Secondary | ICD-10-CM | POA: Insufficient documentation

## 2021-01-28 DIAGNOSIS — E559 Vitamin D deficiency, unspecified: Secondary | ICD-10-CM

## 2021-01-28 DIAGNOSIS — D508 Other iron deficiency anemias: Secondary | ICD-10-CM

## 2021-01-28 DIAGNOSIS — E7849 Other hyperlipidemia: Secondary | ICD-10-CM | POA: Diagnosis not present

## 2021-01-28 DIAGNOSIS — E860 Dehydration: Secondary | ICD-10-CM | POA: Insufficient documentation

## 2021-01-28 DIAGNOSIS — R7303 Prediabetes: Secondary | ICD-10-CM | POA: Diagnosis not present

## 2021-01-28 DIAGNOSIS — R7302 Impaired glucose tolerance (oral): Secondary | ICD-10-CM | POA: Insufficient documentation

## 2021-01-28 MED ORDER — VITAMIN D (ERGOCALCIFEROL) 1.25 MG (50000 UNIT) PO CAPS: 50000.0000 [IU] | ORAL_CAPSULE | ORAL | 0 refills | Status: DC

## 2021-01-28 NOTE — Patient Instructions (Addendum)
The 10-year ASCVD risk score Denman George DC Montez Hageman., et al., 2013) is: 2.6%   Values used to calculate the score:     Age: 55 years     Sex: Female     Is Non-Hispanic African American: Yes     Diabetic: No     Tobacco smoker: Yes     Systolic Blood Pressure: 109 mmHg     Is BP treated: No     HDL Cholesterol: 85 mg/dL     Total Cholesterol: 252 mg/dL

## 2021-01-29 DIAGNOSIS — Z9189 Other specified personal risk factors, not elsewhere classified: Secondary | ICD-10-CM | POA: Insufficient documentation

## 2021-02-05 NOTE — Progress Notes (Signed)
Chief Complaint:   OBESITY Sandra Thornton is here to discuss her progress with her obesity treatment plan along with follow-up of her obesity related diagnoses.   Today's visit was #: 2 Starting weight: 189 lbs Starting date: 01/14/2021 Today's weight: 189 lbs Today's date: 01/28/2021 Total lbs lost to date: 0 Body mass index is 35.71 kg/m.   Interim History:  Sandra Thornton is here today for her first follow-up office visit since starting the program with Sandra Thornton.  We will be reviewing her NEW Meal Plan and will be discussing all recent labs done here and/ or done at outside facilities.  Extended time was spent counseling Sandra [place] on all new disease processes that were discovered or that are worsening.    She is following the meal plan with only minor concerns/ questions today.  Patient's meal and food recall appears to be accurate and consistent with what is on the plan when she is following it.  When on plan, her hunger and cravings are well controlled.    [name] just started the program 1 day ago and had to guy groceries.  She has increased hunger after lunch and says she is weighing her meat for her sandwich.  Current Meal Plan: the Category 1 Plan for 10% of the time.  Current Exercise Plan: None.  Assessment/Plan:   No orders of the defined types were placed in this encounter.   Medications Discontinued During This Encounter  Medication Reason  . Vitamin D, Ergocalciferol, (DRISDOL) 1.25 MG (50000 UNIT) CAPS capsule   . Vitamin D, Ergocalciferol, (DRISDOL) 1.25 MG (50000 UNIT) CAPS capsule   . Vitamin D, Ergocalciferol, (DRISDOL) 1.25 MG (50000 UNIT) CAPS capsule      Meds ordered this encounter  Medications  . DISCONTD: Vitamin D, Ergocalciferol, (DRISDOL) 1.25 MG (50000 UNIT) CAPS capsule    Sig: Take 1 capsule (50,000 Units total) by mouth every 7 (seven) days.    Dispense:  4 capsule    Refill:  0  . DISCONTD: Vitamin D, Ergocalciferol, (DRISDOL) 1.25 MG  (50000 UNIT) CAPS capsule    Sig: Take 1 capsule (50,000 Units total) by mouth every 7 (seven) days.    Dispense:  4 capsule    Refill:  0  . DISCONTD: Vitamin D, Ergocalciferol, (DRISDOL) 1.25 MG (50000 UNIT) CAPS capsule    Sig: Take 1 capsule (50,000 Units total) by mouth every 7 (seven) days.    Dispense:  4 capsule    Refill:  0  . Vitamin D, Ergocalciferol, (DRISDOL) 1.25 MG (50000 UNIT) CAPS capsule    Sig: Take 1 capsule (50,000 Units total) by mouth every 7 (seven) days.    Dispense:  4 capsule    Refill:  0     1. Prediabetes Not at goal. Goal is HgbA1c < 5.7.  Medication: None.    Plan:  New.  Discussed labs with patient today.  New onset, 3 months.  Continue prudent nutritional plan, weight loss.  Recheck and if indicated, will strongly consider medications at that time.  Eventually increase exercise.  She will continue to focus on protein-rich, low simple carbohydrate foods. We reviewed the importance of hydration, regular exercise for stress reduction, and restorative sleep.   Lab Results  Component Value Date   HGBA1C 5.9 (H) 01/14/2021   Lab Results  Component Value Date   INSULIN 8.3 01/14/2021   2. Other hyperlipidemia Course: Not at goal. Lipid-lowering medications: None.  LDL increased for 4 years now.  Plan:  Discussed labs with patient today.  Dietary changes: Increase soluble fiber, decrease simple carbohydrates, decrease saturated fat. Exercise changes: Moderate to vigorous-intensity aerobic activity 150 minutes per week or as tolerated. We will continue to monitor along with PCP/specialists as it pertains to her weight loss journey.  No need for medication.  Continue prudent nutritional plan.  Increase exercise eventually and decrease saturated and trans fats.  Lab Results  Component Value Date   CHOL 252 (H) 01/14/2021   HDL 85 01/14/2021   LDLCALC 157 (H) 01/14/2021   TRIG 60 01/14/2021   CHOLHDL 3.0 01/14/2021   Lab Results  Component Value Date    ALT 12 01/14/2021   AST 12 01/14/2021   ALKPHOS 98 01/14/2021   BILITOT 0.7 01/14/2021   The 10-year ASCVD risk score Sandra George DC Jr., et al., 2013) is: 2.6%   Values used to calculate the score:     Age: 37 years     Sex: Female     Is Non-Hispanic African American: Yes     Diabetic: No     Tobacco smoker: Yes     Systolic Blood Pressure: 109 mmHg     Is BP treated: No     HDL Cholesterol: 85 mg/dL     Total Cholesterol: 252 mg/dL  3. Vitamin D deficiency Not at goal. Current vitamin D is 16.3, tested on 01/14/2021. Optimal goal > 50 ng/dL. She is not taking a supplement.  Plan: Discussed labs with patient today.  - Discussed importance of vitamin D to their health and well-being.  - possible symptoms of low Vitamin D can be low energy, depressed mood, muscle aches, joint aches, osteoporosis etc. - low Vitamin D levels may be linked to an increased risk of cardiovascular events and even increased risk of cancers- such as colon and breast.  - I recommend pt take a weekly prescription vit D - see script below   - Informed patient this may be a lifelong thing, and she was encouraged to continue to take the medicine until told otherwise.   - we will need to monitor levels regularly (every 3-4 mo on average) to keep levels within normal limits.  - weight loss will likely improve availability of vitamin D, thus encouraged [name] to continue with meal plan and their weight loss efforts to further improve this condition - pt's questions and concerns regarding this condition addressed.  - Start Vitamin D, Ergocalciferol, (DRISDOL) 1.25 MG (50000 UNIT) CAPS capsule; Take 1 capsule (50,000 Units total) by mouth every 7 (seven) days.  Dispense: 4 capsule; Refill: 0  4. Mild Dehydration Drinks 1 cup of coffee, maybe 1/2 cup water, one 16 ounce or less sugar-free drink daily.  Elevated BUN, elevated sodium.  Plan:  Discussed labs with patient today.  Her ultimate goal is to increase to 94 ounces  of water per day.  Counseling done.  5. Other iron deficiency anemia Due to menorrhagia.  Had hysterectomy in 2012.  No longer an issue for her.  She is not taking a supplement.  Nutrition: Iron-rich foods include dark leafy greens, red and white meats, eggs, seafood, and beans.  Certain foods and drinks prevent your body from absorbing iron properly. Avoid eating these foods in the same meal as iron-rich foods or with iron supplements. These foods include: coffee, black tea, and red wine; milk, dairy products, and foods that are high in calcium; beans and soybeans; whole grains. Constipation can be a side effect of iron supplementation.  Increased water and fiber intake are helpful. Water goal: > 2 liters/day. Fiber goal: > 25 grams/day.  Plan:  Discussed labs with patient today.  CBC at goal/stable.  Continue prudent nutritional plan.  CBC Latest Ref Rng & Units 01/14/2021 07/28/2018 01/13/2013  WBC 3.4 - 10.8 x10E3/uL 5.8 5.0 6.0  Hemoglobin 11.1 - 15.9 g/dL 16.114.9 09.613.2 04.513.1  Hematocrit 34.0 - 46.6 % 46.2 39.8 37.7  Platelets 150 - 450 x10E3/uL 242 238 228   Lab Results  Component Value Date   VITAMINB12 611 01/14/2021   6. Deficiency of vitamin B12 Lab Results  Component Value Date   VITAMINB12 611 01/14/2021   She is not taking a supplement.  Plan:  Discussed labs with patient today.  within normal limits.  Continue prudent nutritional plan.  7. At risk for diabetes mellitus -Kenney Housemananya is at risk for diabetes due to new onset prediabetes today. - Kenney Housemananya was given diabetes prevention education and counseling today of more than 23 minutes.  - Counseled patient on pathophysiology of disease and meaning/ implication of lab results.  - Reviewed how certain foods can either stimulate or inhibit insulin release, and subsequently affect hunger pathways  - Importance of following a healthy meal plan with limiting amounts of simple carbohydrates discussed with patient - Effects of regular aerobic  exercise on blood sugar regulation reviewed and encouraged an eventual goal of 30 min 5d/week or more as a minimum.  - Briefly discussed treatment options, which always include dietary and lifestyle modification as first line.   - Handouts provided at patient's desire and/or told to go online to the American Diabetes Association website for further information.  8. Class 2 severe obesity with serious comorbidity and body mass index (BMI) of 35.0 to 35.9 in adult, unspecified obesity type [facility](HCC)  Course: Kenney Housemananya is currently in the action stage of change. As such, her goal is to continue with weight loss efforts.   Nutrition goals: She has agreed to the Category 1 Plan with 6 ounces protein at lunch (not 4).   Exercise goals: As is.  Behavioral modification strategies: increasing lean protein intake, decreasing simple carbohydrates, increasing water intake, meal planning and cooking strategies, keeping healthy foods in the home and planning for success.  Kenney Housemananya has agreed to follow-up with our clinic in 2 and 4 weeks. She was informed of the importance of frequent follow-up visits to maximize her success with intensive lifestyle modifications for her multiple health conditions.   Objective:   Blood pressure 109/68, pulse 75, temperature 98.2 F (36.8 C), height 5\' 1"  (1.549 m), weight 189 lb (85.7 kg), SpO2 96 %. Body mass index is 35.71 kg/m.  General: Cooperative, alert, well developed, in no acute distress. HEENT: Conjunctivae and lids unremarkable. Cardiovascular: Regular rhythm.  Lungs: Normal work of breathing. Neurologic: No focal deficits.   Lab Results  Component Value Date   CREATININE 1.00 01/14/2021   BUN 12 01/14/2021   NA 139 01/14/2021   K 4.1 01/14/2021   CL 99 01/14/2021   CO2 22 01/14/2021   Lab Results  Component Value Date   ALT 12 01/14/2021   AST 12 01/14/2021   ALKPHOS 98 01/14/2021   BILITOT 0.7 01/14/2021   Lab Results  Component Value Date   HGBA1C  5.9 (H) 01/14/2021   Lab Results  Component Value Date   INSULIN 8.3 01/14/2021   Lab Results  Component Value Date   TSH 0.800 01/14/2021   Lab Results  Component Value Date  CHOL 252 (H) 01/14/2021   HDL 85 01/14/2021   LDLCALC 157 (H) 01/14/2021   TRIG 60 01/14/2021   CHOLHDL 3.0 01/14/2021   Lab Results  Component Value Date   WBC 5.8 01/14/2021   HGB 14.9 01/14/2021   HCT 46.2 01/14/2021   MCV 87 01/14/2021   PLT 242 01/14/2021   Attestation Statements:   Reviewed by clinician on day of visit: allergies, medications, problem list, medical history, surgical history, family history, social history, and previous encounter notes.  I, Insurance claims handler, CMA, am acting as Energy manager for [employer], DO.  I have reviewed the above documentation for accuracy and completeness, and I agree with the above. Carlye Grippe, D.O.  The 21st Century Cures Act was signed into law in 2016 which includes the topic of electronic health records.  This provides immediate access to information in MyChart.  This includes consultation notes, operative notes, office notes, lab results and pathology reports.  If you have any questions about what you read please let Sandra Thornton know at your next visit so we can discuss your concerns and take corrective action if need be.  We are right here with you.

## 2021-02-07 ENCOUNTER — Other Ambulatory Visit: Payer: Self-pay | Admitting: Family Medicine

## 2021-02-07 DIAGNOSIS — N6489 Other specified disorders of breast: Secondary | ICD-10-CM

## 2021-02-07 DIAGNOSIS — Z1231 Encounter for screening mammogram for malignant neoplasm of breast: Secondary | ICD-10-CM

## 2021-02-11 ENCOUNTER — Other Ambulatory Visit: Payer: Self-pay

## 2021-02-11 ENCOUNTER — Ambulatory Visit (INDEPENDENT_AMBULATORY_CARE_PROVIDER_SITE_OTHER): Payer: BC Managed Care – PPO | Admitting: Family Medicine

## 2021-02-11 ENCOUNTER — Encounter (INDEPENDENT_AMBULATORY_CARE_PROVIDER_SITE_OTHER): Payer: Self-pay | Admitting: Family Medicine

## 2021-02-11 VITALS — BP 106/71 | HR 61 | Temp 97.9°F | Ht 61.0 in | Wt 185.0 lb

## 2021-02-11 DIAGNOSIS — E559 Vitamin D deficiency, unspecified: Secondary | ICD-10-CM

## 2021-02-11 DIAGNOSIS — Z9189 Other specified personal risk factors, not elsewhere classified: Secondary | ICD-10-CM | POA: Diagnosis not present

## 2021-02-11 DIAGNOSIS — Z6835 Body mass index (BMI) 35.0-35.9, adult: Secondary | ICD-10-CM

## 2021-02-11 MED ORDER — VITAMIN D (ERGOCALCIFEROL) 1.25 MG (50000 UNIT) PO CAPS: 50000.0000 [IU] | ORAL_CAPSULE | ORAL | 0 refills | Status: DC

## 2021-02-12 DIAGNOSIS — Z9189 Other specified personal risk factors, not elsewhere classified: Secondary | ICD-10-CM | POA: Insufficient documentation

## 2021-02-20 NOTE — Progress Notes (Signed)
Chief Complaint:   OBESITY Sandra Thornton is here to discuss her progress with her obesity treatment plan along with follow-up of her obesity related diagnoses.   Today's visit was #: 3 Starting weight: 189 lbs Starting date: 01/14/2021 Today's weight: 185 lbs Today's date: 02/11/2021 Total lbs lost to date: 4 lbs Body mass index is 34.96 kg/m.  Total weight loss percentage to date: -2.12%  Interim History:  Sandra Thornton is here for a follow up office visit and she is following the meal plan without concerns or issues.  Patient's meal and food recall appears to be accurate and consistent with what is on the plan.  When on plan, her hunger and cravings are well controlled.    Sandra Thornton ate out a couple times and had pasta ("made with vegetables") one night, broccoli, fried tater tots one night, etc.  Current Meal Plan: the Category 1 Plan with 6 ounces of protein at lunch for 85-90% of the time.  Current Exercise Plan: None.  Assessment/Plan:   Meds ordered this encounter  Medications  . DISCONTD: Vitamin D, Ergocalciferol, (DRISDOL) 1.25 MG (50000 UNIT) CAPS capsule    Sig: Take 1 capsule (50,000 Units total) by mouth every 7 (seven) days.    Dispense:  4 capsule    Refill:  0     1. Vitamin D deficiency Not at goal. Current vitamin D is 16.3, tested on 01/14/2021. Optimal goal > 50 ng/dL.  She is taking vitamin D 50,000 IU weekly.  Tolerating well.  Started at last office visit.  Plan: Continue to take prescription Vitamin D @50 ,000 IU every week as prescribed.  Follow-up for routine testing of Vitamin D, at least 2-3 times per year to avoid over-replacement.  - Refill Vitamin D, Ergocalciferol, (DRISDOL) 1.25 MG (50000 UNIT) CAPS capsule; Take 1 capsule (50,000 Units total) by mouth every 7 (seven) days.  Dispense: 4 capsule; Refill: 0  2. At risk for impaired metabolic function Due to Sandra Thornton's current state of health and medical condition(s), she is at a significantly higher  risk for impaired metabolic function.  She has history of prediabetes.  At least 9 minutes was spent on counseling Sandra Thornton about these concerns today.  This places the patient at a much greater risk to subsequently develop cardio-pulmonary conditions that can negatively affect the patient's quality of life.  I stressed the importance of reversing these risks factors.  The initial goal is to lose at least 5-10% of starting weight to help reduce risk factors.  Counseling:  Intensive lifestyle modifications discussed with Jacques as the most appropriate first line treatment.  she will continue to work on diet, exercise, and weight loss efforts.  We will continue to reassess these conditions on a fairly regular basis in an attempt to decrease the patient's overall morbidity and mortality.  3. Obesity, current BMI 35.1  Course: Sandra Thornton is currently in the action stage of change. As such, her goal is to continue with weight loss efforts.   Nutrition goals: She has agreed to the Category 1 Plan with 6 ounces of protein at lunch.   Exercise goals: As is.  Behavioral modification strategies: increasing lean protein intake, decreasing simple carbohydrates, decreasing eating out, travel eating strategies and planning for success.  Sandra Thornton has agreed to follow-up with our clinic in 2 weeks. She was informed of the importance of frequent follow-up visits to maximize her success with intensive lifestyle modifications for her multiple health conditions.   Objective:   Blood  pressure 106/71, pulse 61, temperature 97.9 F (36.6 C), height 5\' 1"  (1.549 m), weight 185 lb (83.9 kg), SpO2 98 %. Body mass index is 34.96 kg/m.  General: Cooperative, alert, well developed, in no acute distress. HEENT: Conjunctivae and lids unremarkable. Cardiovascular: Regular rhythm.  Lungs: Normal work of breathing. Neurologic: No focal deficits.   Lab Results  Component Value Date   CREATININE 1.00 01/14/2021   BUN 12 01/14/2021    NA 139 01/14/2021   K 4.1 01/14/2021   CL 99 01/14/2021   CO2 22 01/14/2021   Lab Results  Component Value Date   ALT 12 01/14/2021   AST 12 01/14/2021   ALKPHOS 98 01/14/2021   BILITOT 0.7 01/14/2021   Lab Results  Component Value Date   HGBA1C 5.9 (H) 01/14/2021   Lab Results  Component Value Date   INSULIN 8.3 01/14/2021   Lab Results  Component Value Date   TSH 0.800 01/14/2021   Lab Results  Component Value Date   CHOL 252 (H) 01/14/2021   HDL 85 01/14/2021   LDLCALC 157 (H) 01/14/2021   TRIG 60 01/14/2021   CHOLHDL 3.0 01/14/2021   Lab Results  Component Value Date   WBC 5.8 01/14/2021   HGB 14.9 01/14/2021   HCT 46.2 01/14/2021   MCV 87 01/14/2021   PLT 242 01/14/2021   Attestation Statements:   Reviewed by clinician on day of visit: allergies, medications, problem list, medical history, surgical history, family history, social history, and previous encounter notes.  I, 03/16/2021, CMA, am acting as Insurance claims handler for Energy manager, DO.  I have reviewed the above documentation for accuracy and completeness, and I agree with the above. [employer], D.O.  The 21st Century Cures Act was signed into law in 2016 which includes the topic of electronic health records.  This provides immediate access to information in MyChart.  This includes consultation notes, operative notes, office notes, lab results and pathology reports.  If you have any questions about what you read please let 2017 know at your next visit so we can discuss your concerns and take corrective action if need be.  We are right here with you.

## 2021-02-25 ENCOUNTER — Ambulatory Visit (INDEPENDENT_AMBULATORY_CARE_PROVIDER_SITE_OTHER): Payer: BC Managed Care – PPO | Admitting: Family Medicine

## 2021-02-25 ENCOUNTER — Other Ambulatory Visit: Payer: Self-pay

## 2021-02-25 ENCOUNTER — Encounter (INDEPENDENT_AMBULATORY_CARE_PROVIDER_SITE_OTHER): Payer: Self-pay | Admitting: Family Medicine

## 2021-02-25 VITALS — BP 103/68 | HR 78 | Temp 98.5°F | Ht 61.0 in | Wt 182.0 lb

## 2021-02-25 DIAGNOSIS — J3089 Other allergic rhinitis: Secondary | ICD-10-CM

## 2021-02-25 DIAGNOSIS — E559 Vitamin D deficiency, unspecified: Secondary | ICD-10-CM | POA: Diagnosis not present

## 2021-02-25 DIAGNOSIS — Z6835 Body mass index (BMI) 35.0-35.9, adult: Secondary | ICD-10-CM | POA: Diagnosis not present

## 2021-02-25 DIAGNOSIS — Z9189 Other specified personal risk factors, not elsewhere classified: Secondary | ICD-10-CM

## 2021-02-25 MED ORDER — FLUTICASONE PROPIONATE 50 MCG/ACT NA SUSP
2.0000 | Freq: Every day | NASAL | 0 refills | Status: DC
Start: 2021-02-25 — End: 2021-03-18

## 2021-02-25 MED ORDER — VITAMIN D (ERGOCALCIFEROL) 1.25 MG (50000 UNIT) PO CAPS: 50000.0000 [IU] | ORAL_CAPSULE | ORAL | 0 refills | Status: DC

## 2021-03-05 NOTE — Progress Notes (Signed)
Chief Complaint:   OBESITY Sandra Thornton is here to discuss her progress with her obesity treatment plan along with follow-up of her obesity related diagnoses. Sandra Thornton is on the Category 1 Plan with 6 oz of protein at lunch daily and states she is following her eating plan approximately 95% of the time. Sandra Thornton states she is doing 0 minutes 0 times per week.  Today's visit was #: 4 Starting weight: 189 lbs Starting date: 01/14/2021 Today's weight: 182 lbs Today's date: 02/25/2021 Total lbs lost to date: 7 Total lbs lost since last in-office visit: 3  Interim History: Sandra Thornton continues to do well with weight loss. She is getting bored with some of her options especially for breakfast. She will be going to a conference in [place].  Subjective:   1. Vitamin D deficiency Sandra Thornton is stable on Vit D, and she denies nausea or vomiting. She requests a refill today.  2. Other allergic rhinitis Sandra Thornton notes worsening allergies in the last week. She uses Flonase intermittently.  3. At risk for impaired metabolic function Sandra Thornton is at increased risk for impaired metabolic function if protein decreases.  Assessment/Plan:   1. Vitamin D deficiency Low Vitamin D level contributes to fatigue and are associated with obesity, breast, and colon cancer. We will refill prescription Vitamin D for 1 month. Sandra Thornton will follow-up for routine testing of Vitamin D, at least 2-3 times per year to avoid over-replacement.  - Vitamin D, Ergocalciferol, (DRISDOL) 1.25 MG (50000 UNIT) CAPS capsule; Take 1 capsule (50,000 Units total) by mouth every 7 (seven) days.  Dispense: 4 capsule; Refill: 0  2. Other allergic rhinitis Sandra Thornton agreed to start Flonase 2 sprays in each nostril q daily with no refills.   - fluticasone (FLONASE) 50 MCG/ACT nasal spray; Place 2 sprays into both nostrils daily.  Dispense: 16 mL; Refill: 0  3. At risk for impaired metabolic function Sandra Thornton was given approximately 15 minutes of impaired  metabolic  function prevention counseling today. We discussed intensive lifestyle modifications today with an emphasis on specific nutrition and exercise instructions and strategies.   Repetitive spaced learning was employed today to elicit superior memory formation and behavioral change.  4. Obesity with current BMI opf 35.71 Sandra Thornton is currently in the action stage of change. As such, her goal is to continue with weight loss efforts. She has agreed to the Category 3 Plan and keeping a food journal and adhering to recommended goals of 300-400 calories and 25+ grams of protein at breakfast daily.   Behavioral modification strategies: increasing lean protein intake, keeping healthy foods in the home and travel eating strategies.  Sandra Thornton has agreed to follow-up with our clinic in 2 weeks. She was informed of the importance of frequent follow-up visits to maximize her success with intensive lifestyle modifications for her multiple health conditions.   Objective:   Blood pressure 103/68, pulse 78, temperature 98.5 F (36.9 C), height 5\' 1"  (1.549 m), weight 182 lb (82.6 kg), SpO2 99 %. Body mass index is 34.39 kg/m.  General: Cooperative, alert, well developed, in no acute distress. HEENT: Conjunctivae and lids unremarkable. Cardiovascular: Regular rhythm.  Lungs: Normal work of breathing. Neurologic: No focal deficits.   Lab Results  Component Value Date   CREATININE 1.00 01/14/2021   BUN 12 01/14/2021   NA 139 01/14/2021   K 4.1 01/14/2021   CL 99 01/14/2021   CO2 22 01/14/2021   Lab Results  Component Value Date   ALT 12 01/14/2021  AST 12 01/14/2021   ALKPHOS 98 01/14/2021   BILITOT 0.7 01/14/2021   Lab Results  Component Value Date   HGBA1C 5.9 (H) 01/14/2021   Lab Results  Component Value Date   INSULIN 8.3 01/14/2021   Lab Results  Component Value Date   TSH 0.800 01/14/2021   Lab Results  Component Value Date   CHOL 252 (H) 01/14/2021   HDL 85 01/14/2021   LDLCALC 157  (H) 01/14/2021   TRIG 60 01/14/2021   CHOLHDL 3.0 01/14/2021   Lab Results  Component Value Date   WBC 5.8 01/14/2021   HGB 14.9 01/14/2021   HCT 46.2 01/14/2021   MCV 87 01/14/2021   PLT 242 01/14/2021   No results found for: IRON, TIBC, FERRITIN  Attestation Statements:   Reviewed by clinician on day of visit: allergies, medications, problem list, medical history, surgical history, family history, social history, and previous encounter notes.   I, Burt Knack, am acting as transcriptionist for Quillian Quince, MD.  I have reviewed the above documentation for accuracy and completeness, and I agree with the above. -  Quillian Quince, MD

## 2021-03-17 ENCOUNTER — Ambulatory Visit (INDEPENDENT_AMBULATORY_CARE_PROVIDER_SITE_OTHER): Payer: BC Managed Care – PPO | Admitting: Family Medicine

## 2021-03-18 ENCOUNTER — Other Ambulatory Visit: Payer: Self-pay

## 2021-03-18 ENCOUNTER — Encounter (INDEPENDENT_AMBULATORY_CARE_PROVIDER_SITE_OTHER): Payer: Self-pay | Admitting: Family Medicine

## 2021-03-18 ENCOUNTER — Ambulatory Visit (INDEPENDENT_AMBULATORY_CARE_PROVIDER_SITE_OTHER): Payer: BC Managed Care – PPO | Admitting: Family Medicine

## 2021-03-18 VITALS — BP 105/67 | HR 62 | Temp 98.4°F | Ht 61.0 in | Wt 181.0 lb

## 2021-03-18 DIAGNOSIS — E559 Vitamin D deficiency, unspecified: Secondary | ICD-10-CM | POA: Diagnosis not present

## 2021-03-18 DIAGNOSIS — Z6835 Body mass index (BMI) 35.0-35.9, adult: Secondary | ICD-10-CM

## 2021-03-18 DIAGNOSIS — Z9189 Other specified personal risk factors, not elsewhere classified: Secondary | ICD-10-CM

## 2021-03-18 DIAGNOSIS — J3089 Other allergic rhinitis: Secondary | ICD-10-CM | POA: Diagnosis not present

## 2021-03-18 MED ORDER — VITAMIN D (ERGOCALCIFEROL) 1.25 MG (50000 UNIT) PO CAPS: 50000.0000 [IU] | ORAL_CAPSULE | ORAL | 0 refills | Status: DC

## 2021-03-18 MED ORDER — FLUTICASONE PROPIONATE 50 MCG/ACT NA SUSP
2.0000 | Freq: Every day | NASAL | 0 refills | Status: AC
Start: 2021-03-18 — End: ?

## 2021-03-26 NOTE — Progress Notes (Signed)
Chief Complaint:   OBESITY Sandra Thornton is here to discuss her progress with her obesity treatment plan along with follow-up of her obesity related diagnoses.   Today's visit was #: 5 Starting weight: 189 lbs Starting date: 01/14/2021 Today's weight: 181 lbs Today's date: 03/18/2021 Weight change since last visit: 1 lb Total lbs lost to date: 8 lbs Body mass index is 34.2 kg/m.  Total weight loss percentage to date: -4.23%  Interim History:  Sandra Thornton went to [facility] for 5 days and ate out the whole time.  She still managed to lose a pound.    Current Meal Plan: the Category 3 Plan and keeping a food journal and adhering to recommended goals of 300-400 calories and 25+ grams of protein at breakfast for 60% of the time.  Current Exercise Plan: Walking for 30-40 minutes 5 times per week.  Assessment/Plan:   No orders of the defined types were placed in this encounter.   Medications Discontinued During This Encounter  Medication Reason  . Vitamin D, Ergocalciferol, (DRISDOL) 1.25 MG (50000 UNIT) CAPS capsule Reorder  . fluticasone (FLONASE) 50 MCG/ACT nasal spray Reorder     Meds ordered this encounter  Medications  . fluticasone (FLONASE) 50 MCG/ACT nasal spray    Sig: Place 2 sprays into both nostrils daily.    Dispense:  16 mL    Refill:  0  . Vitamin D, Ergocalciferol, (DRISDOL) 1.25 MG (50000 UNIT) CAPS capsule    Sig: Take 1 capsule (50,000 Units total) by mouth every 7 (seven) days.    Dispense:  4 capsule    Refill:  0   1. Vitamin D deficiency Not at goal. Current vitamin D is 16.3, tested on 01/14/2021. Optimal goal > 50 ng/dL. She is taking vitamin D 50,000 IU weekly.  Plan: Continue to take prescription Vitamin D @50 ,000 IU every week as prescribed.  Follow-up for routine testing of Vitamin D, at least 2-3 times per year to avoid over-replacement.  - Refill Vitamin D, Ergocalciferol, (DRISDOL) 1.25 MG (50000 UNIT) CAPS capsule; Take 1 capsule (50,000 Units total) by  mouth every 7 (seven) days.  Dispense: 4 capsule; Refill: 0  2. Other allergic rhinitis Sandra Thornton is using Flonase 2 sprays into both nostrils daily for her allergies.  Plan:  Continue Flonase.  Will refill today, as per below.  - Refill fluticasone (FLONASE) 50 MCG/ACT nasal spray; Place 2 sprays into both nostrils daily.  Dispense: 16 mL; Refill: 0  3. At risk for side effect of medication Due to Sandra Thornton's current conditions and medications, she is at a higher risk for drug side effect.  At least 8 minutes was spent on counseling her about these concerns today.  We discussed the benefits and potential risks of these medications, and all of patient's concerns were addressed and questions were answered.  she will call Sandra Thornton, or their PCP or other specialists who treat their conditions with medications, with any questions or concerns that may develop.    4. Obesity, current BMI 34.4  Course: Sandra Thornton is currently in the action stage of change. As such, her goal is to continue with weight loss efforts.   Nutrition goals: She has agreed to the Category 1 Plan with breakfast options.   Exercise goals: 30 minutes 3-5 days per week.  Behavioral modification strategies: increasing lean protein intake, decreasing simple carbohydrates, travel eating strategies and planning for success.  Nare has agreed to follow-up with our clinic in 2-3 weeks. She was informed of  the importance of frequent follow-up visits to maximize her success with intensive lifestyle modifications for her multiple health conditions.   Objective:   Blood pressure 105/67, pulse 62, temperature 98.4 F (36.9 C), height 5\' 1"  (1.549 m), weight 181 lb (82.1 kg), SpO2 98 %. Body mass index is 34.2 kg/m.  General: Cooperative, alert, well developed, in no acute distress. HEENT: Conjunctivae and lids unremarkable. Cardiovascular: Regular rhythm.  Lungs: Normal work of breathing. Neurologic: No focal deficits.   Lab Results  Component  Value Date   CREATININE 1.00 01/14/2021   BUN 12 01/14/2021   NA 139 01/14/2021   K 4.1 01/14/2021   CL 99 01/14/2021   CO2 22 01/14/2021   Lab Results  Component Value Date   ALT 12 01/14/2021   AST 12 01/14/2021   ALKPHOS 98 01/14/2021   BILITOT 0.7 01/14/2021   Lab Results  Component Value Date   HGBA1C 5.9 (H) 01/14/2021   Lab Results  Component Value Date   INSULIN 8.3 01/14/2021   Lab Results  Component Value Date   TSH 0.800 01/14/2021   Lab Results  Component Value Date   CHOL 252 (H) 01/14/2021   HDL 85 01/14/2021   LDLCALC 157 (H) 01/14/2021   TRIG 60 01/14/2021   CHOLHDL 3.0 01/14/2021   Lab Results  Component Value Date   WBC 5.8 01/14/2021   HGB 14.9 01/14/2021   HCT 46.2 01/14/2021   MCV 87 01/14/2021   PLT 242 01/14/2021   Attestation Statements:   Reviewed by clinician on day of visit: allergies, medications, problem list, medical history, surgical history, family history, social history, and previous encounter notes.  I, 03/16/2021, CMA, am acting as Insurance claims handler for Energy manager, DO.  I have reviewed the above documentation for accuracy and completeness, and I agree with the above. [employer], D.O.  The 21st Century Cures Act was signed into law in 2016 which includes the topic of electronic health records.  This provides immediate access to information in MyChart.  This includes consultation notes, operative notes, office notes, lab results and pathology reports.  If you have any questions about what you read please let 2017 know at your next visit so we can discuss your concerns and take corrective action if need be.  We are right here with you.

## 2021-03-31 ENCOUNTER — Encounter (INDEPENDENT_AMBULATORY_CARE_PROVIDER_SITE_OTHER): Payer: Self-pay | Admitting: Family Medicine

## 2021-03-31 ENCOUNTER — Other Ambulatory Visit: Payer: Self-pay

## 2021-03-31 ENCOUNTER — Ambulatory Visit (INDEPENDENT_AMBULATORY_CARE_PROVIDER_SITE_OTHER): Payer: BC Managed Care – PPO | Admitting: Family Medicine

## 2021-03-31 VITALS — BP 104/68 | HR 65 | Temp 98.0°F | Ht 61.0 in | Wt 177.0 lb

## 2021-03-31 DIAGNOSIS — Z6835 Body mass index (BMI) 35.0-35.9, adult: Secondary | ICD-10-CM | POA: Diagnosis not present

## 2021-03-31 DIAGNOSIS — Z9189 Other specified personal risk factors, not elsewhere classified: Secondary | ICD-10-CM

## 2021-03-31 DIAGNOSIS — E559 Vitamin D deficiency, unspecified: Secondary | ICD-10-CM

## 2021-03-31 MED ORDER — VITAMIN D (ERGOCALCIFEROL) 1.25 MG (50000 UNIT) PO CAPS: 50000.0000 [IU] | ORAL_CAPSULE | ORAL | 0 refills | Status: DC

## 2021-04-03 ENCOUNTER — Other Ambulatory Visit: Payer: Self-pay

## 2021-04-03 ENCOUNTER — Inpatient Hospital Stay: Admission: RE | Admit: 2021-04-03 | Payer: Self-pay | Source: Ambulatory Visit

## 2021-04-03 NOTE — Progress Notes (Signed)
Chief Complaint:   OBESITY Sandra Thornton is here to discuss her progress with her obesity treatment plan along with follow-up of her obesity related diagnoses.   Today's visit was #: 6 Starting weight: 189 lbs Starting date: 01/14/2021 Today's weight: 177 lbs Today's date: 03/31/2021 Weight change since last visit: 4 lbs Total lbs lost to date: 12 lbs Body mass index is 33.44 kg/m.  Total weight loss percentage to date: -6.35%  Interim History:  Sandra Thornton ate out more lately.  She is allergic to fresh fruits, but eats fruit in the can with water, usually pears or peaches.  She is surprised at her weight loss and is happy with the results.  Plan:  Increase water intake.  Increase activity and encouraged her to portion her sweets/desserts to still stay on plan to prevent her own guilty feelings.  Current Meal Plan: the Category 1 Plan with breakfast options..  Current Exercise Plan: Walking for 30 minutes 3 times per week.  Assessment/Plan:   Medications Discontinued During This Encounter  Medication Reason  . Vitamin D, Ergocalciferol, (DRISDOL) 1.25 MG (50000 UNIT) CAPS capsule Reorder     Meds ordered this encounter  Medications  . Vitamin D, Ergocalciferol, (DRISDOL) 1.25 MG (50000 UNIT) CAPS capsule    Sig: Take 1 capsule (50,000 Units total) by mouth every 7 (seven) days.    Dispense:  4 capsule    Refill:  0    1. Vitamin D deficiency Not at goal. Current vitamin D is 16.3, tested on 01/14/2021. Optimal goal > 50 ng/dL.  She is taking vitamin D 50,000 IU weekly.  Plan: Continue to take prescription Vitamin D @50 ,000 IU every week as prescribed.  Follow-up for routine testing of Vitamin D, at least 2-3 times per year to avoid over-replacement.  - Refill Vitamin D, Ergocalciferol, (DRISDOL) 1.25 MG (50000 UNIT) CAPS capsule; Take 1 capsule (50,000 Units total) by mouth every 7 (seven) days.  Dispense: 4 capsule; Refill: 0  2. At risk for dehydration Sandra Thornton is at higher than  average risk of dehydration.  Sandra Thornton was given more than 10 minutes of proper hydration counseling today.  We discussed the signs and symptoms of dehydration, some of which may include muscle cramping, constipation or even orthostatic symptoms.  Counseling on the prevention of dehydration was also provided today.  Sandra Thornton is at risk for dehydration due to weight loss, lifestyle and behavorial habits and possibly due to taking certain medication(s).  She was encouraged to adequately hydrate and monitor fluid status to avoid dehydration as well as weight loss plateaus.  Unless pre-existing renal or cardiopulmonary conditions exist, in which patient was told to limit their fluid intake, I recommended roughly one half of their weight in pounds to be the approximate ounces of non-caloric, non-caffeinated beverages they should drink per day; including more if they are engaging in exercise.  3. Obesity, current BMI 33.6  Course: Sandra Thornton is currently in the action stage of change. As such, her goal is to continue with weight loss efforts.   Nutrition goals: She has agreed to the Category 1 Plan with breakfast options.   Exercise goals: Personal goal:  Walking 30 minutes 5 days per week.  Behavioral modification strategies: increasing lean protein intake, decreasing simple carbohydrates, meal planning and cooking strategies and planning for success.  Sandra Thornton has agreed to follow-up with our clinic in 2-3 weeks. She was informed of the importance of frequent follow-up visits to maximize her success with intensive lifestyle modifications for  her multiple health conditions.   Objective:   Blood pressure 104/68, pulse 65, temperature 98 F (36.7 C), height 5\' 1"  (1.549 m), weight 177 lb (80.3 kg), SpO2 98 %. Body mass index is 33.44 kg/m.  General: Cooperative, alert, well developed, in no acute distress. HEENT: Conjunctivae and lids unremarkable. Cardiovascular: Regular rhythm.  Lungs: Normal work of  breathing. Neurologic: No focal deficits.   Lab Results  Component Value Date   CREATININE 1.00 01/14/2021   BUN 12 01/14/2021   NA 139 01/14/2021   K 4.1 01/14/2021   CL 99 01/14/2021   CO2 22 01/14/2021   Lab Results  Component Value Date   ALT 12 01/14/2021   AST 12 01/14/2021   ALKPHOS 98 01/14/2021   BILITOT 0.7 01/14/2021   Lab Results  Component Value Date   HGBA1C 5.9 (H) 01/14/2021   Lab Results  Component Value Date   INSULIN 8.3 01/14/2021   Lab Results  Component Value Date   TSH 0.800 01/14/2021   Lab Results  Component Value Date   CHOL 252 (H) 01/14/2021   HDL 85 01/14/2021   LDLCALC 157 (H) 01/14/2021   TRIG 60 01/14/2021   CHOLHDL 3.0 01/14/2021   Lab Results  Component Value Date   WBC 5.8 01/14/2021   HGB 14.9 01/14/2021   HCT 46.2 01/14/2021   MCV 87 01/14/2021   PLT 242 01/14/2021   Attestation Statements:   Reviewed by clinician on day of visit: allergies, medications, problem list, medical history, surgical history, family history, social history, and previous encounter notes.  I, 03/16/2021, CMA, am acting as Insurance claims handler for Energy manager, DO.  I have reviewed the above documentation for accuracy and completeness, and I agree with the above. [employer], D.O.  The 21st Century Cures Act was signed into law in 2016 which includes the topic of electronic health records.  This provides immediate access to information in MyChart.  This includes consultation notes, operative notes, office notes, lab results and pathology reports.  If you have any questions about what you read please let 2017 know at your next visit so we can discuss your concerns and take corrective action if need be.  We are right here with you.

## 2021-04-22 ENCOUNTER — Encounter (INDEPENDENT_AMBULATORY_CARE_PROVIDER_SITE_OTHER): Payer: Self-pay | Admitting: Family Medicine

## 2021-04-22 ENCOUNTER — Other Ambulatory Visit: Payer: Self-pay

## 2021-04-22 ENCOUNTER — Ambulatory Visit (INDEPENDENT_AMBULATORY_CARE_PROVIDER_SITE_OTHER): Payer: BC Managed Care – PPO | Admitting: Family Medicine

## 2021-04-22 VITALS — BP 114/71 | HR 54 | Temp 98.2°F | Ht 61.0 in | Wt 176.0 lb

## 2021-04-22 DIAGNOSIS — Z6835 Body mass index (BMI) 35.0-35.9, adult: Secondary | ICD-10-CM | POA: Diagnosis not present

## 2021-04-22 DIAGNOSIS — E559 Vitamin D deficiency, unspecified: Secondary | ICD-10-CM | POA: Diagnosis not present

## 2021-04-22 DIAGNOSIS — Z9189 Other specified personal risk factors, not elsewhere classified: Secondary | ICD-10-CM

## 2021-04-22 MED ORDER — VITAMIN D (ERGOCALCIFEROL) 1.25 MG (50000 UNIT) PO CAPS: 50000.0000 [IU] | ORAL_CAPSULE | ORAL | 0 refills | Status: DC

## 2021-04-24 DIAGNOSIS — Z9189 Other specified personal risk factors, not elsewhere classified: Secondary | ICD-10-CM | POA: Insufficient documentation

## 2021-04-30 NOTE — Progress Notes (Signed)
Chief Complaint:   OBESITY Sandra Thornton is here to discuss her progress with her obesity treatment plan along with follow-up of her obesity related diagnoses.   Today's visit was #: 7 Starting weight: 189 lbs Starting date: 01/14/2021 Today's weight: 176 lbs Today's date: 04/22/2021 Weight change since last visit: 1 lb Total lbs lost to date: 13 lbs Body mass index is 33.25 kg/m.  Total weight loss percentage to date: -6.88%  Interim History:  Sandra Thornton says she is doing more stress eating with end of school stress at work.  Snacking and poor choices with snacks.  Skipping foods/meals also.  Drinks 20-30 ounces of water at most per day now.  Plan:  Planning and prepping discussed with patient in detail today.  Current Meal Plan: the Category 1 Plan with breakfast options for 75% of the time.  Current Exercise Plan: Walking for 30 minutes 2-3 times per week.  Assessment/Plan:   Medications Discontinued During This Encounter  Medication Reason   Vitamin D, Ergocalciferol, (DRISDOL) 1.25 MG (50000 UNIT) CAPS capsule Reorder   Meds ordered this encounter  Medications   Vitamin D, Ergocalciferol, (DRISDOL) 1.25 MG (50000 UNIT) CAPS capsule    Sig: Take 1 capsule (50,000 Units total) by mouth every 7 (seven) days.    Dispense:  4 capsule    Refill:  0   1. Vitamin D deficiency Not at goal. Current vitamin D is 16.33, tested on 16.3. Optimal goal > 50 ng/dL.   Plan: Continue to take prescription Vitamin D @50 ,000 IU every week as prescribed.  Follow-up for routine testing of Vitamin D, at least 2-3 times per year to avoid over-replacement.  - Refill Vitamin D, Ergocalciferol, (DRISDOL) 1.25 MG (50000 UNIT) CAPS capsule; Take 1 capsule (50,000 Units total) by mouth every 7 (seven) days.  Dispense: 4 capsule; Refill: 0  2. At risk for dehydration Sandra Thornton is at higher than average risk of dehydration.  Sandra Thornton was given more than 9 minutes of proper hydration counseling today.  We discussed  the signs and symptoms of dehydration, some of which may include muscle cramping, constipation or even orthostatic symptoms.  Counseling on the prevention of dehydration was also provided today.  Sandra Thornton is at risk for dehydration due to weight loss, lifestyle and behavorial habits and possibly due to taking certain medication(s).  She was encouraged to adequately hydrate and monitor fluid status to avoid dehydration as well as weight loss plateaus.  Unless pre-existing renal or cardiopulmonary conditions exist, in which patient was told to limit their fluid intake, I recommended roughly one half of their weight in pounds to be the approximate ounces of non-caloric, non-caffeinated beverages they should drink per day; including more if they are engaging in exercise.  3. Obesity, current BMI 33.3  Course: Sandra Thornton is currently in the action stage of change. As such, her goal is to continue with weight loss efforts.   Nutrition goals: She has agreed to the Category 1 Plan with breakfast and lunch options.   Exercise goals: For substantial health benefits, adults should do at least 150 minutes (2 hours and 30 minutes) a week of moderate-intensity, or 75 minutes (1 hour and 15 minutes) a week of vigorous-intensity aerobic physical activity, or an equivalent combination of moderate- and vigorous-intensity aerobic activity. Aerobic activity should be performed in episodes of at least 10 minutes, and preferably, it should be spread throughout the week.  Behavioral modification strategies: increasing water intake and no skipping meals.  Sandra Thornton has agreed  to follow-up with our clinic in 3 weeks. She was informed of the importance of frequent follow-up visits to maximize her success with intensive lifestyle modifications for her multiple health conditions.   Objective:   Blood pressure 114/71, pulse (!) 54, temperature 98.2 F (36.8 C), height 5\' 1"  (1.549 m), weight 176 lb (79.8 kg), SpO2 97 %. Body mass index is  33.25 kg/m.  General: Cooperative, alert, well developed, in no acute distress. HEENT: Conjunctivae and lids unremarkable. Cardiovascular: Regular rhythm.  Lungs: Normal work of breathing. Neurologic: No focal deficits.   Lab Results  Component Value Date   CREATININE 1.00 01/14/2021   BUN 12 01/14/2021   NA 139 01/14/2021   K 4.1 01/14/2021   CL 99 01/14/2021   CO2 22 01/14/2021   Lab Results  Component Value Date   ALT 12 01/14/2021   AST 12 01/14/2021   ALKPHOS 98 01/14/2021   BILITOT 0.7 01/14/2021   Lab Results  Component Value Date   HGBA1C 5.9 (H) 01/14/2021   Lab Results  Component Value Date   INSULIN 8.3 01/14/2021   Lab Results  Component Value Date   TSH 0.800 01/14/2021   Lab Results  Component Value Date   CHOL 252 (H) 01/14/2021   HDL 85 01/14/2021   LDLCALC 157 (H) 01/14/2021   TRIG 60 01/14/2021   CHOLHDL 3.0 01/14/2021   Lab Results  Component Value Date   WBC 5.8 01/14/2021   HGB 14.9 01/14/2021   HCT 46.2 01/14/2021   MCV 87 01/14/2021   PLT 242 01/14/2021   Attestation Statements:   Reviewed by clinician on day of visit: allergies, medications, problem list, medical history, surgical history, family history, social history, and previous encounter notes.  I, 03/16/2021, CMA, am acting as Insurance claims handler for Energy manager, DO.  I have reviewed the above documentation for accuracy and completeness, and I agree with the above. [employer], D.O.  The 21st Century Cures Act was signed into law in 2016 which includes the topic of electronic health records.  This provides immediate access to information in MyChart.  This includes consultation notes, operative notes, office notes, lab results and pathology reports.  If you have any questions about what you read please let 2017 know at your next visit so we can discuss your concerns and take corrective action if need be.  We are right here with you.

## 2021-05-13 ENCOUNTER — Other Ambulatory Visit: Payer: Self-pay

## 2021-05-13 ENCOUNTER — Ambulatory Visit (INDEPENDENT_AMBULATORY_CARE_PROVIDER_SITE_OTHER): Payer: BC Managed Care – PPO | Admitting: Family Medicine

## 2021-05-13 ENCOUNTER — Encounter (INDEPENDENT_AMBULATORY_CARE_PROVIDER_SITE_OTHER): Payer: Self-pay | Admitting: Family Medicine

## 2021-05-13 VITALS — BP 101/66 | HR 65 | Ht 61.0 in | Wt 175.0 lb

## 2021-05-13 DIAGNOSIS — E559 Vitamin D deficiency, unspecified: Secondary | ICD-10-CM

## 2021-05-13 DIAGNOSIS — Z6835 Body mass index (BMI) 35.0-35.9, adult: Secondary | ICD-10-CM | POA: Diagnosis not present

## 2021-05-13 DIAGNOSIS — Z9189 Other specified personal risk factors, not elsewhere classified: Secondary | ICD-10-CM

## 2021-05-13 MED ORDER — VITAMIN D (ERGOCALCIFEROL) 1.25 MG (50000 UNIT) PO CAPS: 50000.0000 [IU] | ORAL_CAPSULE | ORAL | 0 refills | Status: DC

## 2021-05-22 NOTE — Progress Notes (Signed)
Chief Complaint:   OBESITY Sandra Thornton is here to discuss her progress with her obesity treatment plan along with follow-up of her obesity related diagnoses.   Today's visit was #: 8 Starting weight: 189 lbs Starting date: 01/14/2021 Today's weight: 175 lbs Today's date: 05/13/2021 Weight change since last visit: 1 lb Total lbs lost to date: 14 lbs Body mass index is 33.07 kg/m.  Total weight loss percentage to date: -7.41%  Interim History:  Sandra Thornton is frustrated with only losing one pound.  However, bioimpedance scale shows lower fat mass, increased muscle mass, and increased water weight.  For breakfast, Special K cereal.  Lunch, sandwich and 1/2 cup of fruit.  Dinner is meat or seafood and 1 cup of vegetables.  For snack, rice cake, popcorn, occasional fruit.  She eats off plan foods 10% of the time.  Current Meal Plan: the Category 1 Plan with breakfast and lunch options for 90% of the time.  Current Exercise Plan: Walking for 30 minutes 3-4 times per week.  Assessment/Plan:   Medications Discontinued During This Encounter  Medication Reason   Vitamin D, Ergocalciferol, (DRISDOL) 1.25 MG (50000 UNIT) CAPS capsule Reorder    Meds ordered this encounter  Medications   Vitamin D, Ergocalciferol, (DRISDOL) 1.25 MG (50000 UNIT) CAPS capsule    Sig: Take 1 capsule (50,000 Units total) by mouth every 7 (seven) days.    Dispense:  4 capsule    Refill:  0    1. Vitamin D deficiency Not at goal.  No vitamin D for 2-3 weeks now.  Has not picked up from pharmacy.  Not sure if still there.  Plan: Start to take prescription Vitamin D @50 ,000 IU every week as prescribed.  Follow-up for routine testing of Vitamin D, at least 2-3 times per year to avoid over-replacement.  Lab Results  Component Value Date   VD25OH 16.3 (L) 01/14/2021   - Refill Vitamin D, Ergocalciferol, (DRISDOL) 1.25 MG (50000 UNIT) CAPS capsule; Take 1 capsule (50,000 Units total) by mouth every 7 (seven) days.   Dispense: 4 capsule; Refill: 0  2. At risk for deficient intake of food Genie was given extensive education and counseling today of more than 9 minutes on risks associated with deficient food intake.  Counseled her on the importance of following our prescribed meal plan and eating adequate amounts of protein.  Discussed with Kenney Houseman [place] that inadequate food intake over longer periods of time can slow their metabolism down significantly.   3. Obesity with current BMI of 33.07  Course: Shannen is currently in the action stage of change. As such, her goal is to continue with weight loss efforts.   Nutrition goals: She has agreed to the Category 1 Plan or keeping a food journal and adhering to recommended goals of 1000 calories and 75 grams of protein with breakfast and lunch options.  Exercise goals: For substantial health benefits, adults should do at least 150 minutes (2 hours and 30 minutes) a week of moderate-intensity, or 75 minutes (1 hour and 15 minutes) a week of vigorous-intensity aerobic physical activity, or an equivalent combination of moderate- and vigorous-intensity aerobic activity. Aerobic activity should be performed in episodes of at least 10 minutes, and preferably, it should be spread throughout the week.  Behavioral modification strategies: increasing lean protein intake, decreasing simple carbohydrates, better snacking choices, and avoiding temptations.  Lacee has agreed to follow-up with our clinic in 2-3 weeks. She was informed of the importance of frequent  follow-up visits to maximize her success with intensive lifestyle modifications for her multiple health conditions.   Objective:   Blood pressure 101/66, pulse 65, height 5\' 1"  (1.549 m), weight 175 lb (79.4 kg), SpO2 99 %. Body mass index is 33.07 kg/m.  General: Cooperative, alert, well developed, in no acute distress. HEENT: Conjunctivae and lids unremarkable. Cardiovascular: Regular rhythm.  Lungs: Normal  work of breathing. Neurologic: No focal deficits.   Lab Results  Component Value Date   CREATININE 1.00 01/14/2021   BUN 12 01/14/2021   NA 139 01/14/2021   K 4.1 01/14/2021   CL 99 01/14/2021   CO2 22 01/14/2021   Lab Results  Component Value Date   ALT 12 01/14/2021   AST 12 01/14/2021   ALKPHOS 98 01/14/2021   BILITOT 0.7 01/14/2021   Lab Results  Component Value Date   HGBA1C 5.9 (H) 01/14/2021   Lab Results  Component Value Date   INSULIN 8.3 01/14/2021   Lab Results  Component Value Date   TSH 0.800 01/14/2021   Lab Results  Component Value Date   CHOL 252 (H) 01/14/2021   HDL 85 01/14/2021   LDLCALC 157 (H) 01/14/2021   TRIG 60 01/14/2021   CHOLHDL 3.0 01/14/2021   Lab Results  Component Value Date   VD25OH 16.3 (L) 01/14/2021   Lab Results  Component Value Date   WBC 5.8 01/14/2021   HGB 14.9 01/14/2021   HCT 46.2 01/14/2021   MCV 87 01/14/2021   PLT 242 01/14/2021   Attestation Statements:   Reviewed by clinician on day of visit: allergies, medications, problem list, medical history, surgical history, family history, social history, and previous encounter notes.  I, 03/16/2021, CMA, am acting as Insurance claims handler for Energy manager, DO.  I have reviewed the above documentation for accuracy and completeness, and I agree with the above. [employer], D.O.  The 21st Century Cures Act was signed into law in 2016 which includes the topic of electronic health records.  This provides immediate access to information in MyChart.  This includes consultation notes, operative notes, office notes, lab results and pathology reports.  If you have any questions about what you read please let 2017 know at your next visit so we can discuss your concerns and take corrective action if need be.  We are right here with you.

## 2021-06-04 ENCOUNTER — Other Ambulatory Visit: Payer: Self-pay

## 2021-06-04 ENCOUNTER — Ambulatory Visit (INDEPENDENT_AMBULATORY_CARE_PROVIDER_SITE_OTHER): Payer: BC Managed Care – PPO | Admitting: Family Medicine

## 2021-06-04 ENCOUNTER — Encounter (INDEPENDENT_AMBULATORY_CARE_PROVIDER_SITE_OTHER): Payer: Self-pay | Admitting: Family Medicine

## 2021-06-04 VITALS — BP 108/70 | HR 53 | Temp 98.2°F | Ht 61.0 in | Wt 175.0 lb

## 2021-06-04 DIAGNOSIS — Z9189 Other specified personal risk factors, not elsewhere classified: Secondary | ICD-10-CM | POA: Diagnosis not present

## 2021-06-04 DIAGNOSIS — E559 Vitamin D deficiency, unspecified: Secondary | ICD-10-CM

## 2021-06-04 DIAGNOSIS — R7303 Prediabetes: Secondary | ICD-10-CM

## 2021-06-04 DIAGNOSIS — Z6835 Body mass index (BMI) 35.0-35.9, adult: Secondary | ICD-10-CM

## 2021-06-04 MED ORDER — METFORMIN HCL 500 MG PO TABS: 500.0000 mg | ORAL_TABLET | Freq: Two times a day (BID) | ORAL | 3 refills | Status: DC

## 2021-06-04 MED ORDER — VITAMIN D (ERGOCALCIFEROL) 1.25 MG (50000 UNIT) PO CAPS: 50000.0000 [IU] | ORAL_CAPSULE | ORAL | 0 refills | Status: AC

## 2021-06-04 MED ORDER — METFORMIN HCL 500 MG PO TABS: 500.0000 mg | ORAL_TABLET | Freq: Every day | ORAL | 0 refills | Status: AC

## 2021-06-11 NOTE — Progress Notes (Signed)
Chief Complaint:   OBESITY Sandra Thornton is here to discuss her progress with her obesity treatment plan along with follow-up of her obesity related diagnoses. Sandra Thornton is on the Category 1 Plan and keeping a food journal and adhering to recommended goals of 1000 calories and 75 grams of protein or cat 1 plan and states she is following her eating plan approximately 65% of the time. Sandra Thornton states she is walking for 20-30 minutes 2 times per week.  Today's visit was #: 9 Starting weight: 189 lbs Starting date: 01/14/2021 Today's weight: 175 lbs Today's date: 06/04/2021 Total lbs lost to date: 14 Total lbs lost since last in-office visit: 0  Interim History: Sandra Thornton is trying to adhere to Category 1. She does feel constrained with her diet and would like more options. However, she feels journaling all day does not work well for her.  Subjective:   1. Prediabetes Sandra Thornton's last A1C was elevated at 5.9.She is not on Metformin. She has occasional polyphagia.  Lab Results  Component Value Date   HGBA1C 5.9 (H) 01/14/2021   Lab Results  Component Value Date   INSULIN 8.3 01/14/2021     2. Vitamin D deficiency Sandra Thornton's Vitamin D is very low at 16.3. She is  on weekly Vitamin D.  Lab Results  Component Value Date   VD25OH 16.3 (L) 01/14/2021    3. At risk for diabetes mellitus Sandra Thornton is a risk for diabetes mellitus due to Pre-diabetes and obesity.   Assessment/Plan:   1. Prediabetes We will refill Sandra Thornton's Metformin 500 mg by mouth daily with meal for 1 month with no refills. She may take 1/2 pill if GI upset.  - metFORMIN (GLUCOPHAGE) 500 MG tablet; Take 1 tablet (500 mg total) by mouth daily with breakfast.  Dispense: 30 tablet; Refill: 0  2. Vitamin D deficiency  We will refill Vitamin D  with no refills. She agrees to continue to take prescription Vitamin D @50 ,000 IU every week and will follow-up for routine testing of Vitamin D, at least 2-3 times per year to avoid  over-replacement.  - Vitamin D, Ergocalciferol, (DRISDOL) 1.25 MG (50000 UNIT) CAPS capsule; Take 1 capsule (50,000 Units total) by mouth every 7 (seven) days.  Dispense: 4 capsule; Refill: 0  3. At risk for diabetes mellitus Sandra Thornton was given approximately 15 minutes of diabetes education and counseling today. We discussed intensive lifestyle modifications today with an emphasis on weight loss as well as increasing exercise and decreasing simple carbohydrates in her diet. We also reviewed medication options with an emphasis on risk versus benefit of those discussed.   Repetitive spaced learning was employed today to elicit superior memory formation and behavioral change.   4. Obesity with current BMI of 33.08 Sandra Thornton is currently in the action stage of change. As such, her goal is to continue with weight loss efforts. She has agreed to the Category 1 Plan.   Sandra Thornton was provided calorie/protein goals for all meals. Handout: Recipes.  Exercise goals:  As is, discussed starting resistance training.  Behavioral modification strategies: increasing lean protein intake and meal planning and cooking strategies.  Sandra Thornton has agreed to follow-up with our clinic in 3-4 weeks(fasting) .  Objective:   Blood pressure 108/70, pulse (!) 53, temperature 98.2 F (36.8 C), height 5\' 1"  (1.549 m), weight 175 lb (79.4 kg), SpO2 97 %. Body mass index is 33.07 kg/m.  General: Cooperative, alert, well developed, in no acute distress. HEENT: Conjunctivae and lids unremarkable. Cardiovascular: Regular  rhythm.  Lungs: Normal work of breathing. Neurologic: No focal deficits.   Lab Results  Component Value Date   CREATININE 1.00 01/14/2021   BUN 12 01/14/2021   NA 139 01/14/2021   K 4.1 01/14/2021   CL 99 01/14/2021   CO2 22 01/14/2021   Lab Results  Component Value Date   ALT 12 01/14/2021   AST 12 01/14/2021   ALKPHOS 98 01/14/2021   BILITOT 0.7 01/14/2021   Lab Results  Component Value Date    HGBA1C 5.9 (H) 01/14/2021   Lab Results  Component Value Date   INSULIN 8.3 01/14/2021   Lab Results  Component Value Date   TSH 0.800 01/14/2021   Lab Results  Component Value Date   CHOL 252 (H) 01/14/2021   HDL 85 01/14/2021   LDLCALC 157 (H) 01/14/2021   TRIG 60 01/14/2021   CHOLHDL 3.0 01/14/2021   Lab Results  Component Value Date   VD25OH 16.3 (L) 01/14/2021   Lab Results  Component Value Date   WBC 5.8 01/14/2021   HGB 14.9 01/14/2021   HCT 46.2 01/14/2021   MCV 87 01/14/2021   PLT 242 01/14/2021   No results found for: IRON, TIBC, FERRITIN  Attestation Statements:   Reviewed by clinician on day of visit: allergies, medications, problem list, medical history, surgical history, family history, social history, and previous encounter notes.  I, Sindy Messing, am acting as Energy manager for [place], FNP.  I have reviewed the above documentation for accuracy and completeness, and I agree with the above. -  Jesse Sans, FNP

## 2021-06-12 DIAGNOSIS — R7303 Prediabetes: Secondary | ICD-10-CM | POA: Insufficient documentation

## 2021-06-26 ENCOUNTER — Ambulatory Visit (INDEPENDENT_AMBULATORY_CARE_PROVIDER_SITE_OTHER): Payer: BC Managed Care – PPO | Admitting: Family Medicine

## 2021-07-16 ENCOUNTER — Ambulatory Visit (INDEPENDENT_AMBULATORY_CARE_PROVIDER_SITE_OTHER): Payer: BC Managed Care – PPO | Admitting: Family Medicine

## 2021-07-16 ENCOUNTER — Encounter (INDEPENDENT_AMBULATORY_CARE_PROVIDER_SITE_OTHER): Payer: Self-pay

## 2021-07-16 DIAGNOSIS — Z5329 Procedure and treatment not carried out because of patient's decision for other reasons: Secondary | ICD-10-CM

## 2021-12-11 ENCOUNTER — Encounter (HOSPITAL_BASED_OUTPATIENT_CLINIC_OR_DEPARTMENT_OTHER): Payer: Self-pay

## 2021-12-11 ENCOUNTER — Emergency Department (HOSPITAL_BASED_OUTPATIENT_CLINIC_OR_DEPARTMENT_OTHER): Payer: BC Managed Care – PPO

## 2021-12-11 ENCOUNTER — Other Ambulatory Visit: Payer: Self-pay

## 2021-12-11 ENCOUNTER — Emergency Department (HOSPITAL_BASED_OUTPATIENT_CLINIC_OR_DEPARTMENT_OTHER)
Admission: EM | Admit: 2021-12-11 | Discharge: 2021-12-11 | Disposition: A | Discharge status: Home / Self Care | Payer: BC Managed Care – PPO | Attending: Emergency Medicine | Admitting: Emergency Medicine

## 2021-12-11 DIAGNOSIS — R0789 Other chest pain: Secondary | ICD-10-CM | POA: Insufficient documentation

## 2021-12-11 DIAGNOSIS — M7989 Other specified soft tissue disorders: Secondary | ICD-10-CM | POA: Insufficient documentation

## 2021-12-11 LAB — CBC
HCT: 40.3 % (ref 36.0–46.0)
Hemoglobin: 13.2 g/dL (ref 12.0–15.0)
MCH: 28.3 pg (ref 26.0–34.0)
MCHC: 32.8 g/dL (ref 30.0–36.0)
MCV: 86.3 fL (ref 80.0–100.0)
Platelets: 260 10*3/uL (ref 150–400)
RBC: 4.67 MIL/uL (ref 3.87–5.11)
RDW: 11.9 % (ref 11.5–15.5)
WBC: 6.4 10*3/uL (ref 4.0–10.5)
nRBC: 0 % (ref 0.0–0.2)

## 2021-12-11 LAB — BASIC METABOLIC PANEL
Anion gap: 9 (ref 5–15)
BUN: 19 mg/dL (ref 6–20)
CO2: 26 mmol/L (ref 22–32)
Calcium: 8.6 mg/dL — ABNORMAL LOW (ref 8.9–10.3)
Chloride: 104 mmol/L (ref 98–111)
Creatinine, Ser: 0.77 mg/dL (ref 0.44–1.00)
GFR, Estimated: >60 mL/min (ref >60)
Glucose, Bld: 110 mg/dL — ABNORMAL HIGH (ref 70–99)
Potassium: 3.5 mmol/L (ref 3.5–5.1)
Sodium: 139 mmol/L (ref 135–145)

## 2021-12-11 LAB — TROPONIN I (HIGH SENSITIVITY): Troponin I (High Sensitivity): <2 ng/L (ref <18)

## 2021-12-11 NOTE — Discharge Instructions (Addendum)
Your work-up in the ER today was unremarkable for acute abnormalities.  I recommend that you call your PCP to schedule an appointment in the next few days for further evaluation of your symptoms. I have also given you a referral to cardiology for additional evaluation. Please call to schedule an appointment.  Return if your chest pain returns of if you develop any new or worsening symptoms.

## 2021-12-11 NOTE — ED Provider Notes (Signed)
[facility] EMERGENCY DEPARTMENT Provider Note   CSN: [id number] Arrival date & time: 12/11/21  1116     History  Chief Complaint  Patient presents with   Multiple c/o    Sandra Thornton is a 56 y.o. female.  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 She states that 1 week ago she noticed that her left arm was swelling into the left side of her neck. She states that soon after this she began to have intermittent chest pain that is left sided without discernable trigger with some radiation into her left arm. States that this pain is mostly resolved, however lingers slightly, and feels like tightness. It is not pleuritic in nature and is not reproducible to palpation. Denies and associated shortness of breath or nausea/vomiting.  Family history significant for her father who had COPD and an MI at age 63.  Patient has a history of high cholesterol, does not take medication for this, no history of hypertension or diabetes.  Patient is a non-smoker.   The history is provided by the patient. No language interpreter was used.      Home Medications Prior to Admission medications   Medication Sig Start Date End Date Taking? Authorizing Provider  fluticasone (FLONASE) 50 MCG/ACT nasal spray Place 2 sprays into both nostrils daily. 03/18/21   Mellody Dance, DO  metFORMIN (GLUCOPHAGE) 500 MG tablet Take 1 tablet (500 mg total) by mouth daily with breakfast. 06/04/21   Whitmire, Joneen Boers, FNP  Vitamin D, Ergocalciferol, (DRISDOL) 1.25 MG (50000 UNIT) CAPS capsule Take 1 capsule (50,000 Units total) by mouth every 7 (seven) days. 06/04/21   Whitmire, Joneen Boers, FNP      Allergies    Elemental sulfur and Sulfa antibiotics    Review of Systems   Review of Systems  Constitutional:  Negative for chills and fever.  Respiratory:  Negative for apnea, cough, choking,  chest tightness, shortness of breath, wheezing and stridor.   Cardiovascular:  Positive for chest pain and leg swelling. Negative for palpitations.  Gastrointestinal:  Negative for diarrhea, nausea and vomiting.  Allergic/Immunologic: Negative for immunocompromised state.  Neurological:  Negative for headaches.  Psychiatric/Behavioral:  Negative for confusion and decreased concentration.   All other systems reviewed and are negative.  Physical Exam Updated Vital Signs BP 97/60    Pulse 76    Temp 98.2 F (36.8 C) (Oral)    Resp 18    Ht 5\' 1"  (1.549 m)    Wt 84.4 kg    SpO2 92%    BMI 35.14 kg/m  Physical Exam Vitals and nursing note reviewed.  Constitutional:      General: She is not in acute distress.    Appearance: Normal appearance. She is normal weight. She is not ill-appearing, toxic-appearing or diaphoretic.     Comments: Patient resting comfortably in bed in no acute distress  HENT:     Head: Normocephalic and atraumatic.  Eyes:     Extraocular Movements: Extraocular movements intact.     Pupils: Pupils are equal, round, and reactive to light.  Cardiovascular:     Rate and Rhythm: Normal rate and regular rhythm.     Pulses: Normal pulses.          Radial pulses are 2+ on the right side and 2+ on the left side.       Dorsalis  pedis pulses are 2+ on the right side and 2+ on the left side.       Posterior tibial pulses are 2+ on the right side and 2+ on the left side.     Heart sounds: Normal heart sounds.  Pulmonary:     Effort: Pulmonary effort is normal. No respiratory distress.     Breath sounds: Normal breath sounds. No stridor. No wheezing, rhonchi or rales.  Abdominal:     General: Abdomen is flat.     Palpations: Abdomen is soft.  Musculoskeletal:        General: Normal range of motion.     Cervical back: Normal range of motion and neck supple.     Right lower leg: No edema.     Left lower leg: No edema.     Comments: Mild edema noted throughout left upper  extremity without erythema or tenderness. No palpable cord preset. Radial pulse 2+ capillary refill less than 2 seconds  Tenderness noted to left popliteal fossa without erythema or edema or palpable cord present. DP and PT pulses intact and 2+ with cap refill less than 2 seconds  Full painless ROM noted to bilateral upper and lower extremities with 5/5 strength  Skin:    General: Skin is warm and dry.  Neurological:     General: No focal deficit present.     Mental Status: She is alert.  Psychiatric:        Mood and Affect: Mood normal.        Behavior: Behavior normal.    ED Results / Procedures / Treatments   Labs (all labs ordered are listed, but only abnormal results are displayed) Labs Reviewed  BASIC METABOLIC PANEL  CBC  TROPONIN I (HIGH SENSITIVITY)    EKG None  Radiology No results found.  Procedures Procedures    Medications Ordered in ED Medications - No data to display  ED Course/ Medical Decision Making/ A&P                           Medical Decision Making Amount and/or Complexity of Data Reviewed Labs: ordered. Radiology: ordered. ECG/medicine tests: ordered.   This patient presents to the ED for concern of chest pain and left upper and left lower extremity swelling and pain, this involves an extensive number of treatment options, and is a complaint that carries with it a high risk of complications and morbidity.   Co morbidities that complicate the patient evaluation  hyperlipidemia    Lab Tests:  I Ordered, and personally interpreted labs.  The pertinent results include:  no leukocytosis, no anemia, no electrolyte abnormalities. Troponin less than 2   Imaging Studies ordered:  I ordered imaging studies including CXR, LUE and LLE ultrasound  I independently visualized and interpreted imaging which showed no active cardiopulmonary disease, no evidence of DVT I agree with the radiologist interpretation   Cardiac Monitoring:  The  patient was maintained on a cardiac monitor.  I personally viewed and interpreted the cardiac monitored which showed an underlying rhythm of: sinus rhythm    Test Considered:  I considered BNP and d-dimer, however patient denied any shortness of breath, pleuritic pain, exertional symptoms, PND, or orthopnea. No leg swelling. Low suspicion of PE or CHF   Dispostion:  After consideration of the diagnostic results and the patients response to treatment, I feel that the patent would benefit from outpatient management with PCP and cardiology referral.   Given  the large differential diagnosis for [facility], the decision making in this case is of high complexity.  After evaluating all of the data points in this case, the presentation of Jadey Roden Haynie is NOT consistent with Acute Coronary Syndrome (ACS) and/or myocardial ischemia, pulmonary embolism, aortic dissection; Borhaave's, significant arrythmia, pneumothorax, cardiac tamponade, or other emergent cardiopulmonary condition.  Further, the presentation of Yumeka Valentino Livecchi is NOT consistent with pericarditis, myocarditis, mediastinitis, endocarditis, new valvular disease.  Additionally, the presentation of Mercedees Stockholm Wingateis NOT consistent with flail chest, cardiac contusion, ARDS, or significant intra-thoracic or intra-abdominal bleeding.  Moreover, this presentation is NOT consistent with pneumonia, or sepsis  The patient has a   Heart Score of 3. Upon reevaluation, patients chest pain has completely resolved. Troponin negative. EKG normal sinus. No evidence of DVT on [name]. She is afebrile, non-toxic appearing, and in no acute distress with reassuring vital signs. She is stable for discharge at this time. Patient referred to PCP for follow-up to discuss outpatient cardiac work-up. Patient is understanding, amenable with plan. Educated on red flag symptoms that would prompt immediate return. Discharged in stable  condition.   Strict return and follow-up precautions have been given by me personally or by detailed written instruction given verbally by nursing staff using the teach back method to the patient/family/caregiver(s).  Data Reviewed/Counseling: I have reviewed the patient's vital signs, nursing notes, and other relevant tests/information. I had a detailed discussion regarding the historical points, exam findings, and any diagnostic results supporting the discharge diagnosis. I also discussed the need for outpatient follow-up and the need to return to the ED if symptoms worsen or if there are any questions or concerns that arise at home.  Final Clinical Impression(s) / ED Diagnoses Final diagnoses:  Other chest pain    Rx / DC Orders ED Discharge Orders     None     An After Visit Summary was printed and given to the patient.     Bud Face, PA-C [id number]    Lianne Cure, DO [id number]

## 2021-12-11 NOTE — ED Triage Notes (Addendum)
Pt c/o swelling to left UE and CP started this am-also c/o pain to left LE x 1 week-denies injury to left UE or left LE-also c/o "shooting pain" to head-NAD-steady gait

## 2021-12-11 NOTE — ED Notes (Signed)
Patient transported to X-ray  ultrasound 

## 2022-06-24 ENCOUNTER — Encounter (INDEPENDENT_AMBULATORY_CARE_PROVIDER_SITE_OTHER): Payer: Self-pay

## 2022-08-24 ENCOUNTER — Other Ambulatory Visit: Payer: Self-pay

## 2022-08-24 ENCOUNTER — Emergency Department (HOSPITAL_BASED_OUTPATIENT_CLINIC_OR_DEPARTMENT_OTHER): Payer: BC Managed Care – PPO

## 2022-08-24 ENCOUNTER — Emergency Department (HOSPITAL_BASED_OUTPATIENT_CLINIC_OR_DEPARTMENT_OTHER)
Admission: EM | Admit: 2022-08-24 | Discharge: 2022-08-24 | Disposition: A | Discharge status: Home / Self Care | Payer: BC Managed Care – PPO | Attending: Emergency Medicine | Admitting: Emergency Medicine

## 2022-08-24 DIAGNOSIS — R202 Paresthesia of skin: Secondary | ICD-10-CM | POA: Insufficient documentation

## 2022-08-24 DIAGNOSIS — M25512 Pain in left shoulder: Secondary | ICD-10-CM | POA: Diagnosis present

## 2022-08-24 LAB — CBC WITH DIFFERENTIAL/PLATELET
Abs Immature Granulocytes: 0.01 10*3/uL (ref 0.00–0.07)
Basophils Absolute: 0 10*3/uL (ref 0.0–0.1)
Basophils Relative: 1 %
Eosinophils Absolute: 0.1 10*3/uL (ref 0.0–0.5)
Eosinophils Relative: 3 %
HCT: 42.9 % (ref 36.0–46.0)
Hemoglobin: 13.5 g/dL (ref 12.0–15.0)
Immature Granulocytes: 0 %
Lymphocytes Relative: 36 %
Lymphs Abs: 1.7 10*3/uL (ref 0.7–4.0)
MCH: 28 pg (ref 26.0–34.0)
MCHC: 31.5 g/dL (ref 30.0–36.0)
MCV: 88.8 fL (ref 80.0–100.0)
Monocytes Absolute: 0.3 10*3/uL (ref 0.1–1.0)
Monocytes Relative: 7 %
Neutro Abs: 2.6 10*3/uL (ref 1.7–7.7)
Neutrophils Relative %: 53 %
Platelets: 260 10*3/uL (ref 150–400)
RBC: 4.83 MIL/uL (ref 3.87–5.11)
RDW: 12.1 % (ref 11.5–15.5)
WBC: 4.8 10*3/uL (ref 4.0–10.5)
nRBC: 0 % (ref 0.0–0.2)

## 2022-08-24 LAB — BASIC METABOLIC PANEL
Anion gap: 8 (ref 5–15)
BUN: 16 mg/dL (ref 6–20)
CO2: 26 mmol/L (ref 22–32)
Calcium: 9.3 mg/dL (ref 8.9–10.3)
Chloride: 107 mmol/L (ref 98–111)
Creatinine, Ser: 0.85 mg/dL (ref 0.44–1.00)
GFR, Estimated: >60 mL/min (ref >60)
Glucose, Bld: 99 mg/dL (ref 70–99)
Potassium: 3.8 mmol/L (ref 3.5–5.1)
Sodium: 141 mmol/L (ref 135–145)

## 2022-08-24 LAB — TROPONIN I (HIGH SENSITIVITY)
Troponin I (High Sensitivity): 3 ng/L (ref <18)
Troponin I (High Sensitivity): <2 ng/L (ref <18)

## 2022-08-24 NOTE — ED Triage Notes (Signed)
Pt c/o "chest pain" but pinpoints pain to shoulder area above clavicle near trapezius muscle. Reports waking up with this pain and L arm numbness at 0400 this morning. Pt reports arm numbness is somewhat better but not resolved, shoulder pain continues. No strenuous activity or neck injury that she is aware of. Sleeps on her sides (both R and L) with bottom hand tucked under her head. No SHOB, nausea, or other sx.

## 2022-08-24 NOTE — Discharge Instructions (Signed)
You were seen in the emergency department for pain in your left shoulder that went into your chest along with some numbness in your left arm.  You had blood work x-rays EKG that did not show any obvious signs of heart injury.  Please try NSAIDs such as Aleve or ibuprofen.  Follow-up with your primary care doctor.  Return to the emergency department if any worsening or concerning symptoms

## 2022-08-24 NOTE — ED Provider Notes (Signed)
[facility] EMERGENCY DEPARTMENT Provider Note   CSN: [id number] Arrival date & time: 08/24/22  0701     History  Chief Complaint  Patient presents with   Shoulder Pain    Sandra Thornton is a 56 y.o. female.  Has a history of Graves' disease.  She said she woke up this morning and noticed some numbness in her left arm.  It was associated with some sharp pain up in her left neck and left trapezius shoulder area.  She felt a little dizzy with it.  It radiates into her left chest area.  It is improving but not completely gone.  She was concerned this could be heart attack symptom.  She has no history of cardiac disease.  She does have family history of heart problems. The history is provided by the patient.  Shoulder Pain Location:  Shoulder Shoulder location:  L shoulder Injury: no   Pain details:    Quality:  Sharp and aching   Radiates to:  Chest   Severity:  Moderate   Timing:  Constant   Progression:  Improving Prior injury to area:  No Relieved by:  None tried Worsened by:  Nothing Ineffective treatments:  None tried Associated symptoms: neck pain, numbness and tingling   Associated symptoms: no fever and no swelling        Home Medications Prior to Admission medications   Medication Sig Start Date End Date Taking? Authorizing Provider  fluticasone (FLONASE) 50 MCG/ACT nasal spray Place 2 sprays into both nostrils daily. 03/18/21   Thomasene Lot, DO  metFORMIN (GLUCOPHAGE) 500 MG tablet Take 1 tablet (500 mg total) by mouth daily with breakfast. 06/04/21   Whitmire, Thermon Leyland, FNP  Vitamin D, Ergocalciferol, (DRISDOL) 1.25 MG (50000 UNIT) CAPS capsule Take 1 capsule (50,000 Units total) by mouth every 7 (seven) days. 06/04/21   Whitmire, Thermon Leyland, FNP      Allergies    Elemental sulfur and Sulfa antibiotics    Review of Systems   Review of Systems  Constitutional:  Negative for fever.  Eyes:  Negative for visual disturbance.  Respiratory:  Negative for  shortness of breath.   Cardiovascular:  Positive for chest pain.  Musculoskeletal:  Positive for neck pain.  Neurological:  Positive for dizziness and numbness. Negative for weakness and headaches.    Physical Exam Updated Vital Signs BP 121/78   Pulse 63   Temp 97.6 F (36.4 C) (Oral)   Resp 17   Ht 5' 1.5" (1.562 m)   Wt 85.7 kg   SpO2 100%   BMI 35.13 kg/m  Physical Exam Vitals and nursing note reviewed.  Constitutional:      General: She is not in acute distress.    Appearance: Normal appearance. She is well-developed.  HENT:     Head: Normocephalic and atraumatic.  Eyes:     Conjunctiva/sclera: Conjunctivae normal.  Cardiovascular:     Rate and Rhythm: Normal rate and regular rhythm.     Pulses: Normal pulses.     Heart sounds: No murmur heard. Pulmonary:     Effort: Pulmonary effort is normal. No respiratory distress.     Breath sounds: Normal breath sounds.  Abdominal:     Palpations: Abdomen is soft.     Tenderness: There is no abdominal tenderness. There is no guarding or rebound.  Musculoskeletal:        General: Tenderness (Mild tenderness in the left trapezius area to palpation) present. Normal range of motion.  Cervical back: Neck supple. No tenderness.  Skin:    General: Skin is warm and dry.     Capillary Refill: Capillary refill takes less than 2 seconds.  Neurological:     General: No focal deficit present.     Mental Status: She is alert and oriented to person, place, and time.     Cranial Nerves: No cranial nerve deficit.     Sensory: No sensory deficit.     Motor: No weakness.     Gait: Gait normal.     ED Results / Procedures / Treatments   Labs (all labs ordered are listed, but only abnormal results are displayed) Labs Reviewed  BASIC METABOLIC PANEL  CBC WITH DIFFERENTIAL/PLATELET  TROPONIN I (HIGH SENSITIVITY)  TROPONIN I (HIGH SENSITIVITY)    EKG EKG Interpretation  Date/Time:  Monday August 24 2022 07:32:14  EDT Ventricular Rate:  54 PR Interval:  213 QRS Duration: 116 QT Interval:  439 QTC Calculation: 416 R Axis:   16 Text Interpretation: Sinus rhythm Prolonged PR interval Nonspecific intraventricular conduction delay Low voltage, precordial leads New PR prolongation from prior 1/23 Confirmed by Aletta Edouard [phone]) on 08/24/2022 7:36:14 AM  Radiology DG Shoulder Left  Result Date: 08/24/2022 CLINICAL DATA:  Left shoulder pain. EXAM: LEFT SHOULDER - 2+ VIEW COMPARISON:  None Available. FINDINGS: There is no evidence of fracture or dislocation. There is no evidence of arthropathy or other focal bone abnormality. Soft tissues are unremarkable. IMPRESSION: Negative. Electronically Signed   By: Misty Stanley M.D.   On: 08/24/2022 08:21   DG Chest 2 View  Result Date: 08/24/2022 CLINICAL DATA:  Left shoulder pain. EXAM: CHEST - 2 VIEW COMPARISON:  12/11/2021 FINDINGS: The lungs are clear without focal pneumonia, edema, pneumothorax or pleural effusion. The cardiopericardial silhouette is within normal limits for size. The visualized bony structures of the thorax are unremarkable. Telemetry leads overlie the chest. IMPRESSION: No active cardiopulmonary disease. Electronically Signed   By: Misty Stanley M.D.   On: 08/24/2022 08:20    Procedures Procedures    Medications Ordered in ED Medications - No data to display  ED Course/ Medical Decision Making/ A&P Clinical Course as of 08/24/22 1742  Mon Aug 24, 2022  0828 Patient's chest x-ray and left shoulder x-ray did not show any acute findings.  Awaiting radiology reading. [MB]  1214 Reviewed results of work-up with patient.  Recommended symptomatic treatment with ice heat, NSAIDs and follow-up with PCP.  She is comfortable plan.  Return instructions discussed [MB]    Clinical Course User Index [MB] Hayden Rasmussen, MD                           Medical Decision Making Amount and/or Complexity of Data Reviewed Labs: ordered. Radiology:  ordered.   This patient complains of left shoulder and chest pain, left arm numbness; this involves an extensive number of treatment Options and is a complaint that carries with it a high risk of complications and morbidity. The differential includes radiculopathy, neuropraxia, musculoskeletal, ACS, pneumonia, pneumothorax, PE  I ordered, reviewed and interpreted labs, which included CBC and chemistries normal, troponins flat I ordered imaging studies which included chest x-ray and left shoulder x-ray and I independently    visualized and interpreted imaging which showed no acute findings Previous records obtained and reviewed in epic no recent admissions  Cardiac monitoring reviewed, normal sinus rhythm Social determinants considered, no significant barriers Critical Interventions: None  After the interventions stated above, I reevaluated the patient and found patient symptoms to be improving. Admission and further testing considered, no indications for admission or further work-up at this time.  Recommended symptomatic treatment and follow-up with PCP.  Return instructions discussed.         Final Clinical Impression(s) / ED Diagnoses Final diagnoses:  Acute pain of left shoulder  Paresthesia and pain of left extremity    Rx / DC Orders ED Discharge Orders     None         Hayden Rasmussen, MD 08/24/22 1743

## 2022-09-04 ENCOUNTER — Encounter: Payer: Self-pay | Admitting: Cardiology

## 2022-09-06 NOTE — Progress Notes (Signed)
This encounter was created in error - please disregard.

## 2022-09-06 NOTE — Procedures (Signed)
Erroneous encounter

## 2023-05-18 IMAGING — US US EXTREM  UP VENOUS*L*
1 series · 14 of 24 positions shown · non-contrast
Comparison: Chest XR, concurrent.

CLINICAL DATA: LEFT upper extremity pain.  History of DVT.



[Series 2: us extrem up venous*left* · 14 of 25 slices shown]
[im 1/25]
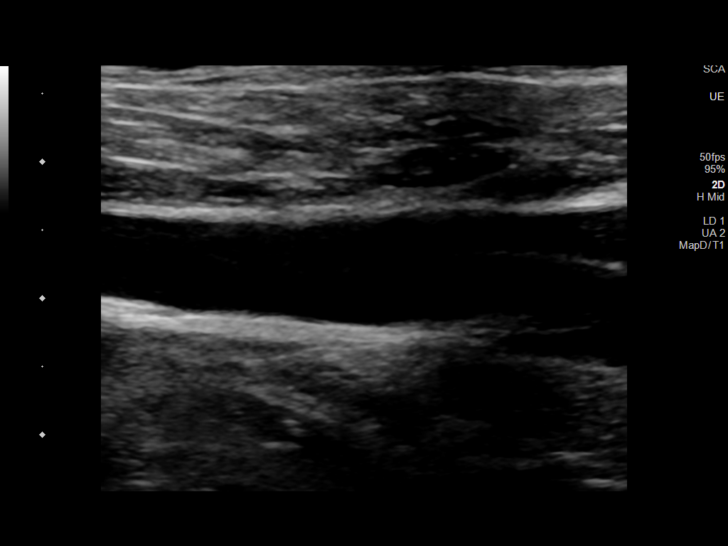
[im 3/25]
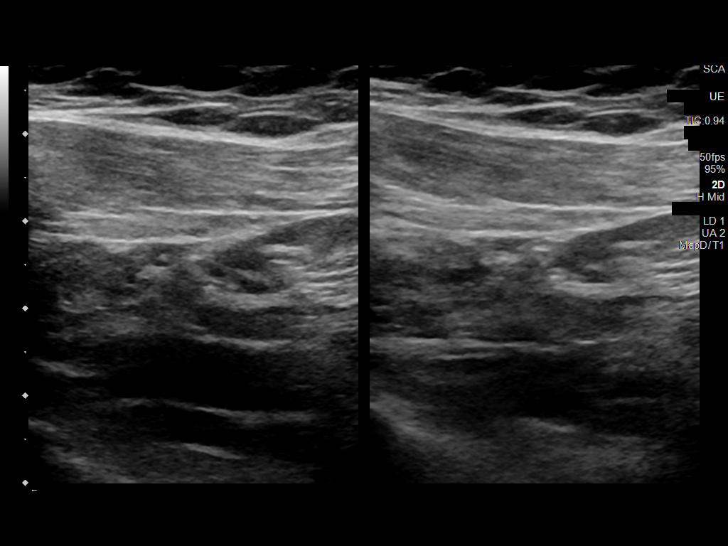
[im 5/25]
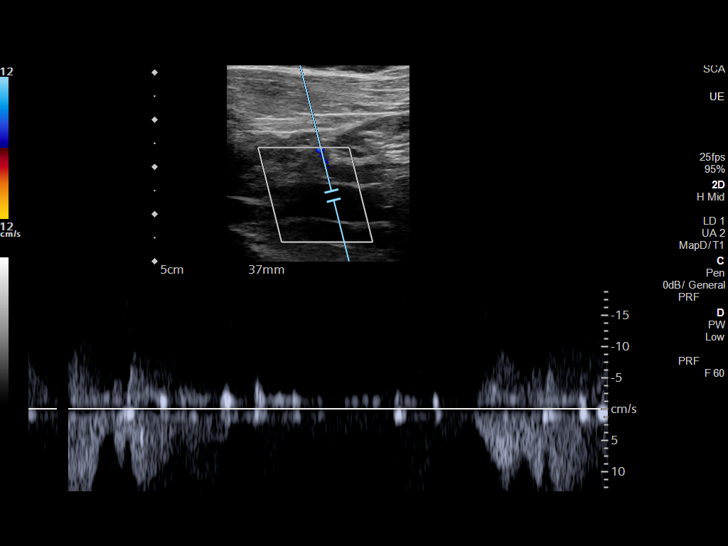
[im 7/25]
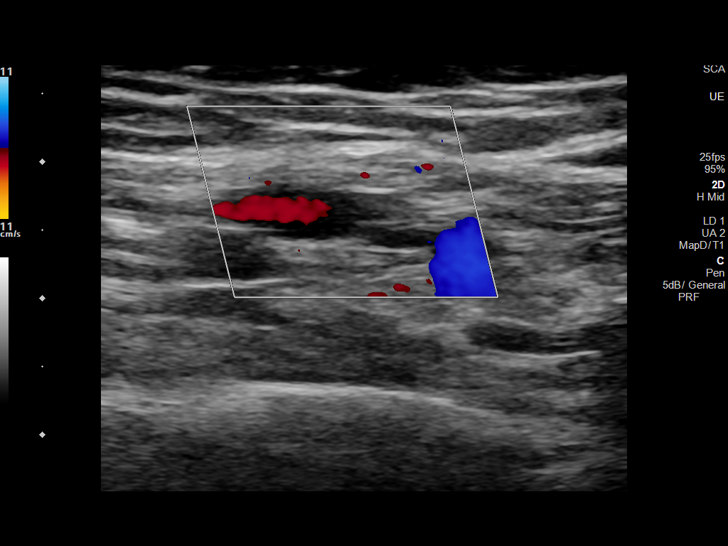
[im 8/25]
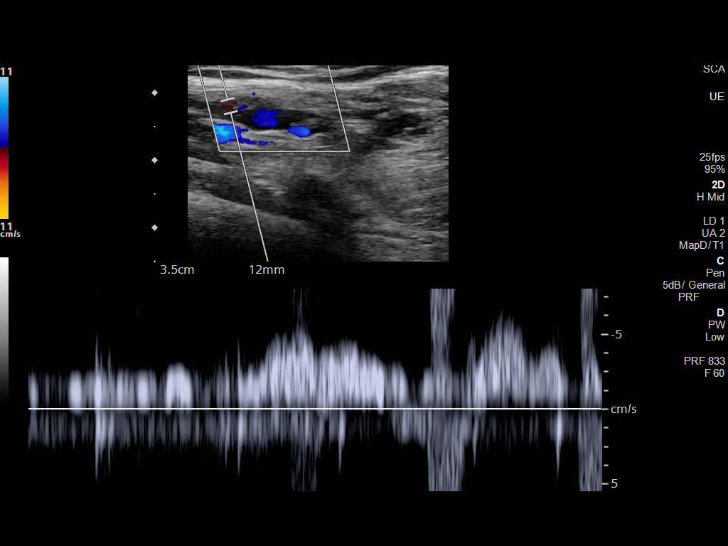
[im 10/25]
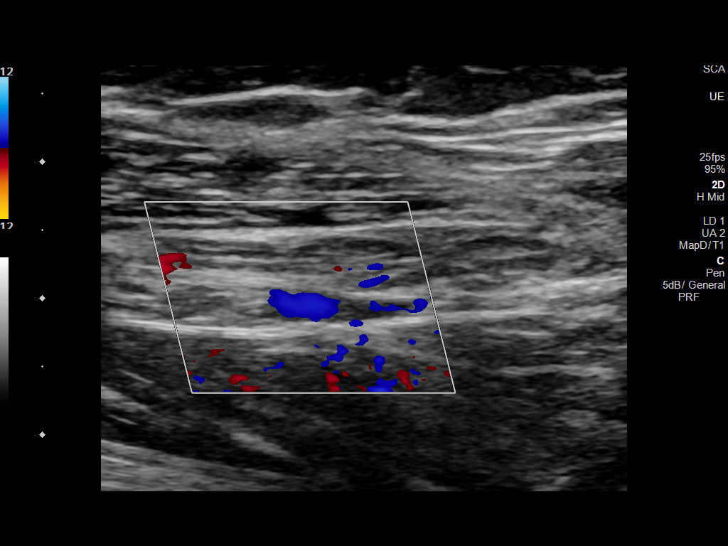
[im 12/25]
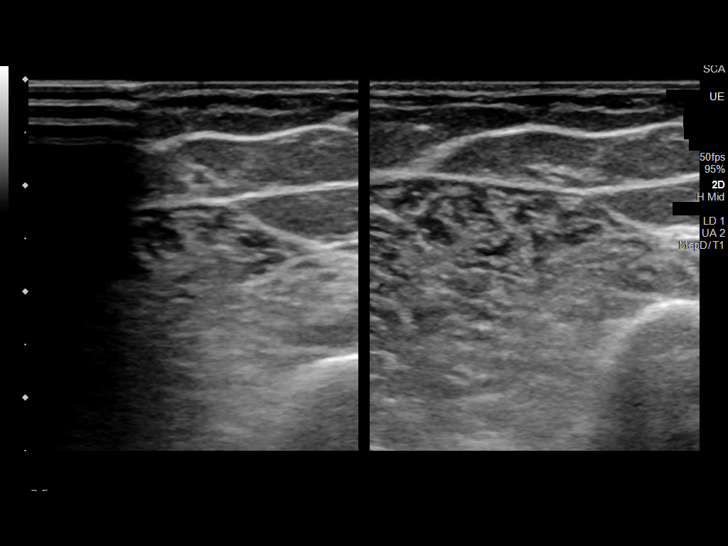
[im 13/25]
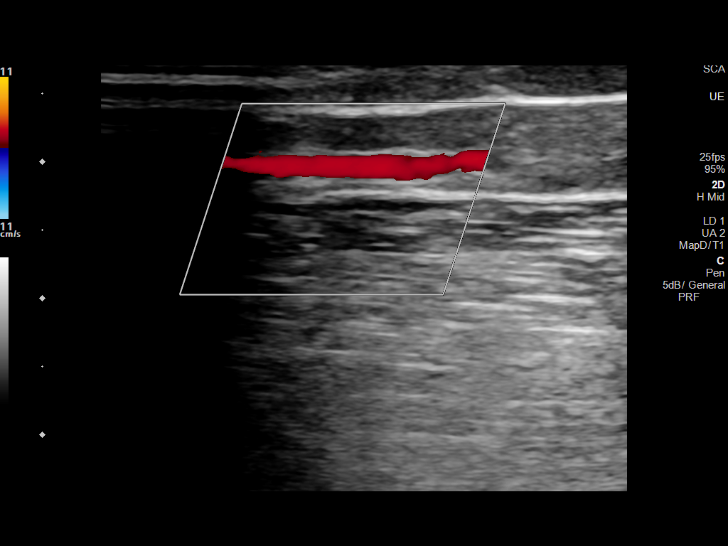
[im 15/25]
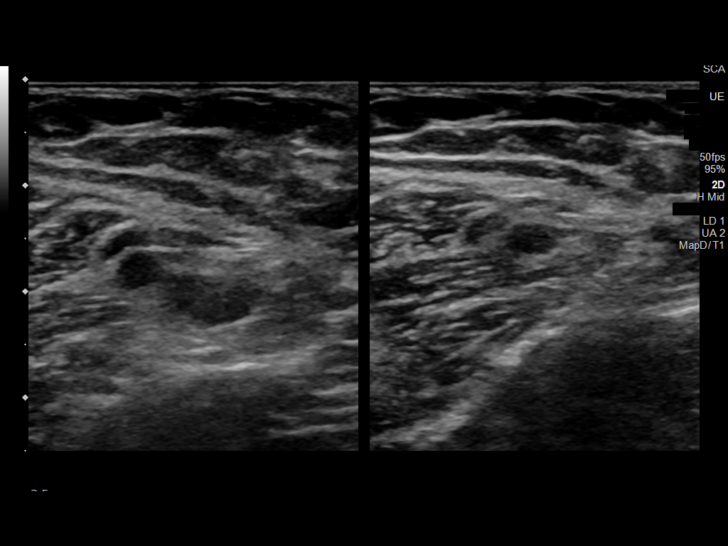
[im 17/25]
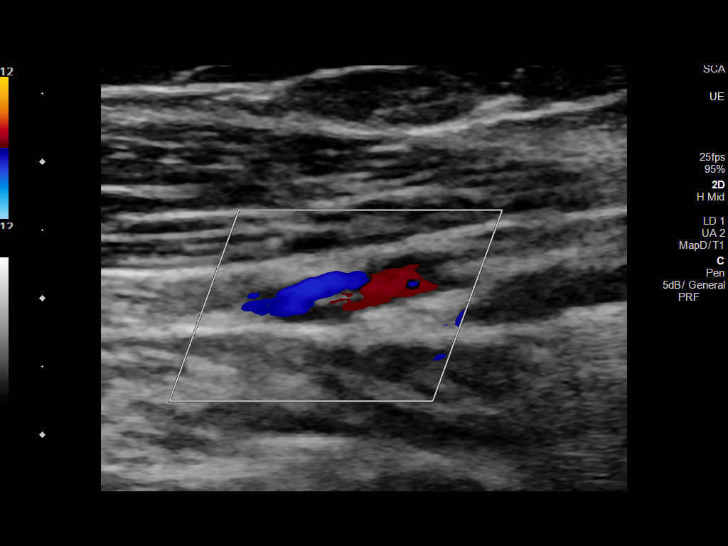
[im 19/25]
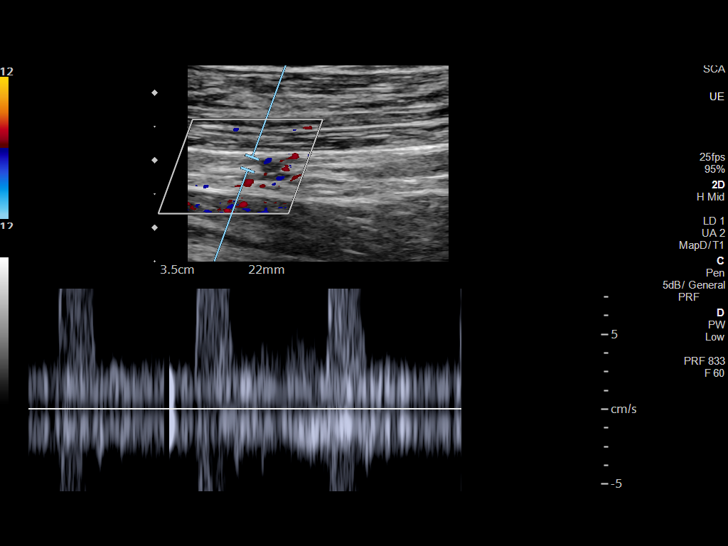
[im 20/25]
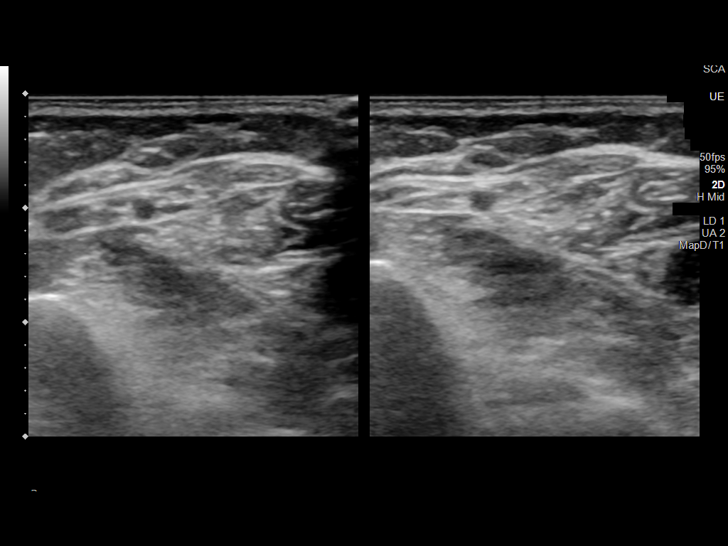
[im 22/25]
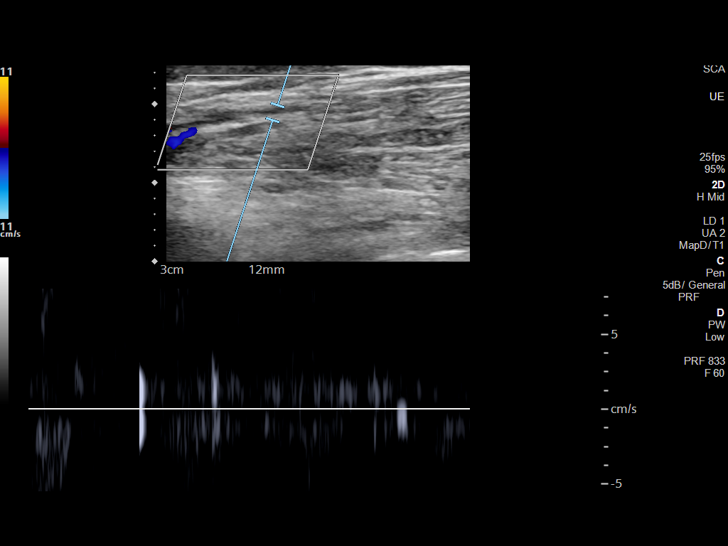
[im 25/25]
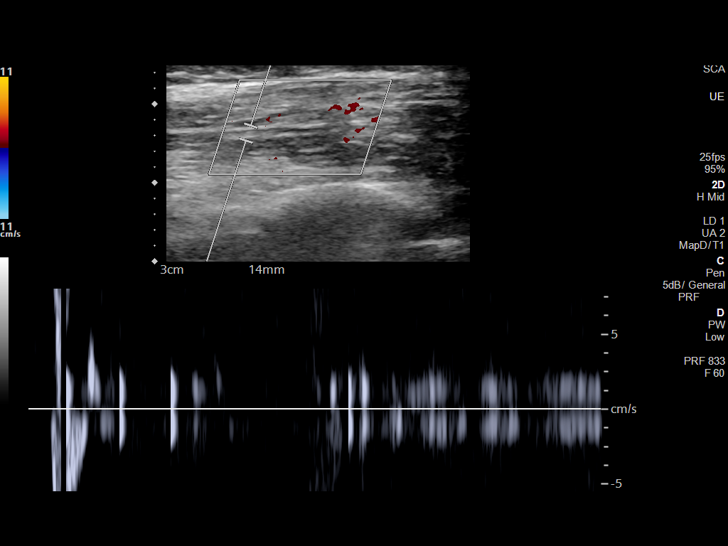

[14 of 24 positions shown; findings below may reference images not displayed]

FINDINGS: VENOUS

Normal compressibility of the LEFT internal jugular, subclavian,
axillary, cephalic, basilic, brachial, radial and ulnar veins. No
filling defects to suggest DVT on grayscale or color Doppler
imaging. Doppler waveforms show normal direction of venous flow,
normal respiratory plasticity and response to augmentation.

Limited views of the contralateral subclavian vein are unremarkable.

OTHER

No evidence of superficial thrombophlebitis or abnormal fluid
collection.

Limitations: none
IMPRESSION: No evidence of DVT within the LEFT upper extremity.

## 2023-05-18 IMAGING — CR DG CHEST 2V
2 series · 2 of 2 positions shown · non-contrast
Comparison: July 28, 2018.

CLINICAL DATA: Chest pain.

EXAM:
CHEST - 2 VIEW

[w chest pa]
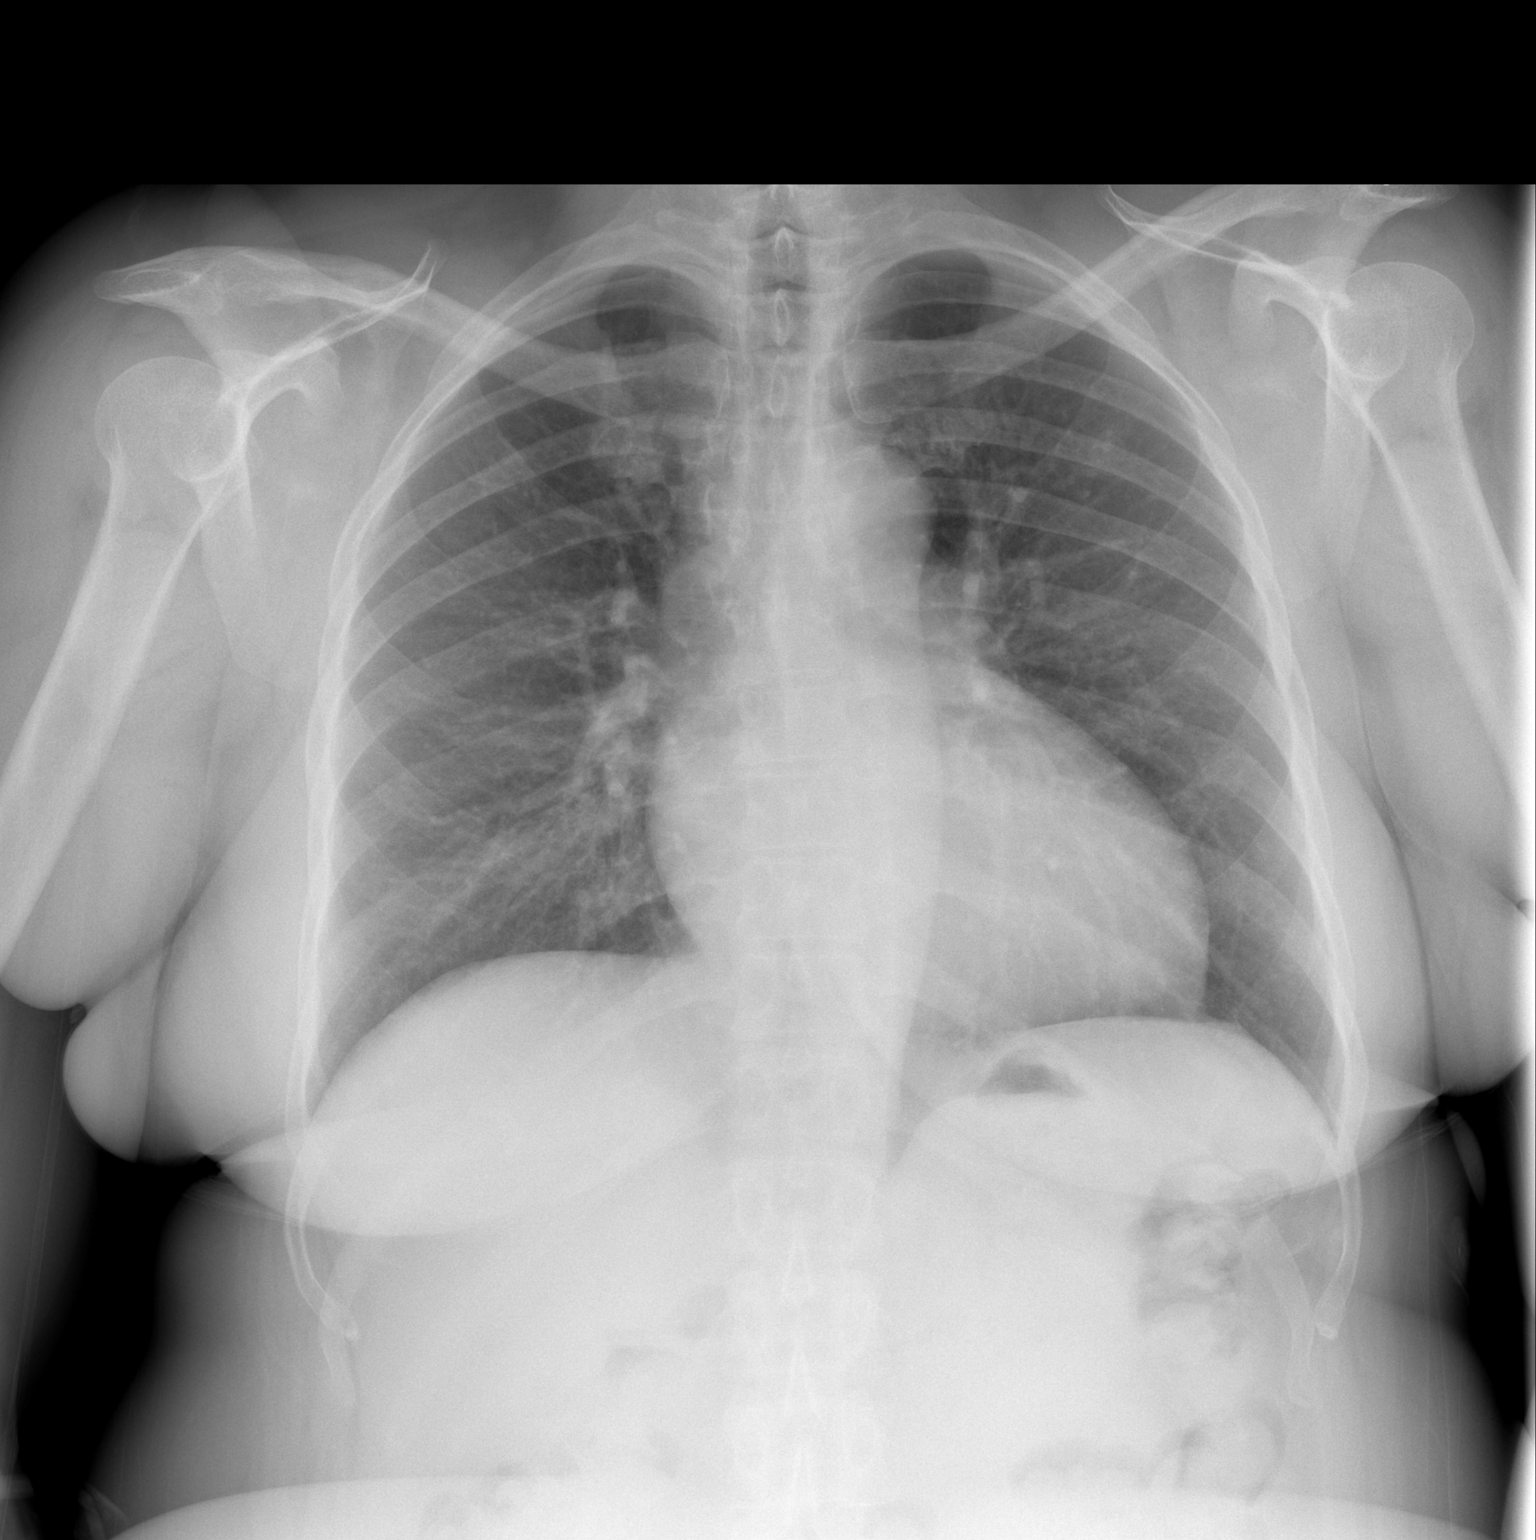

[w chest lat]
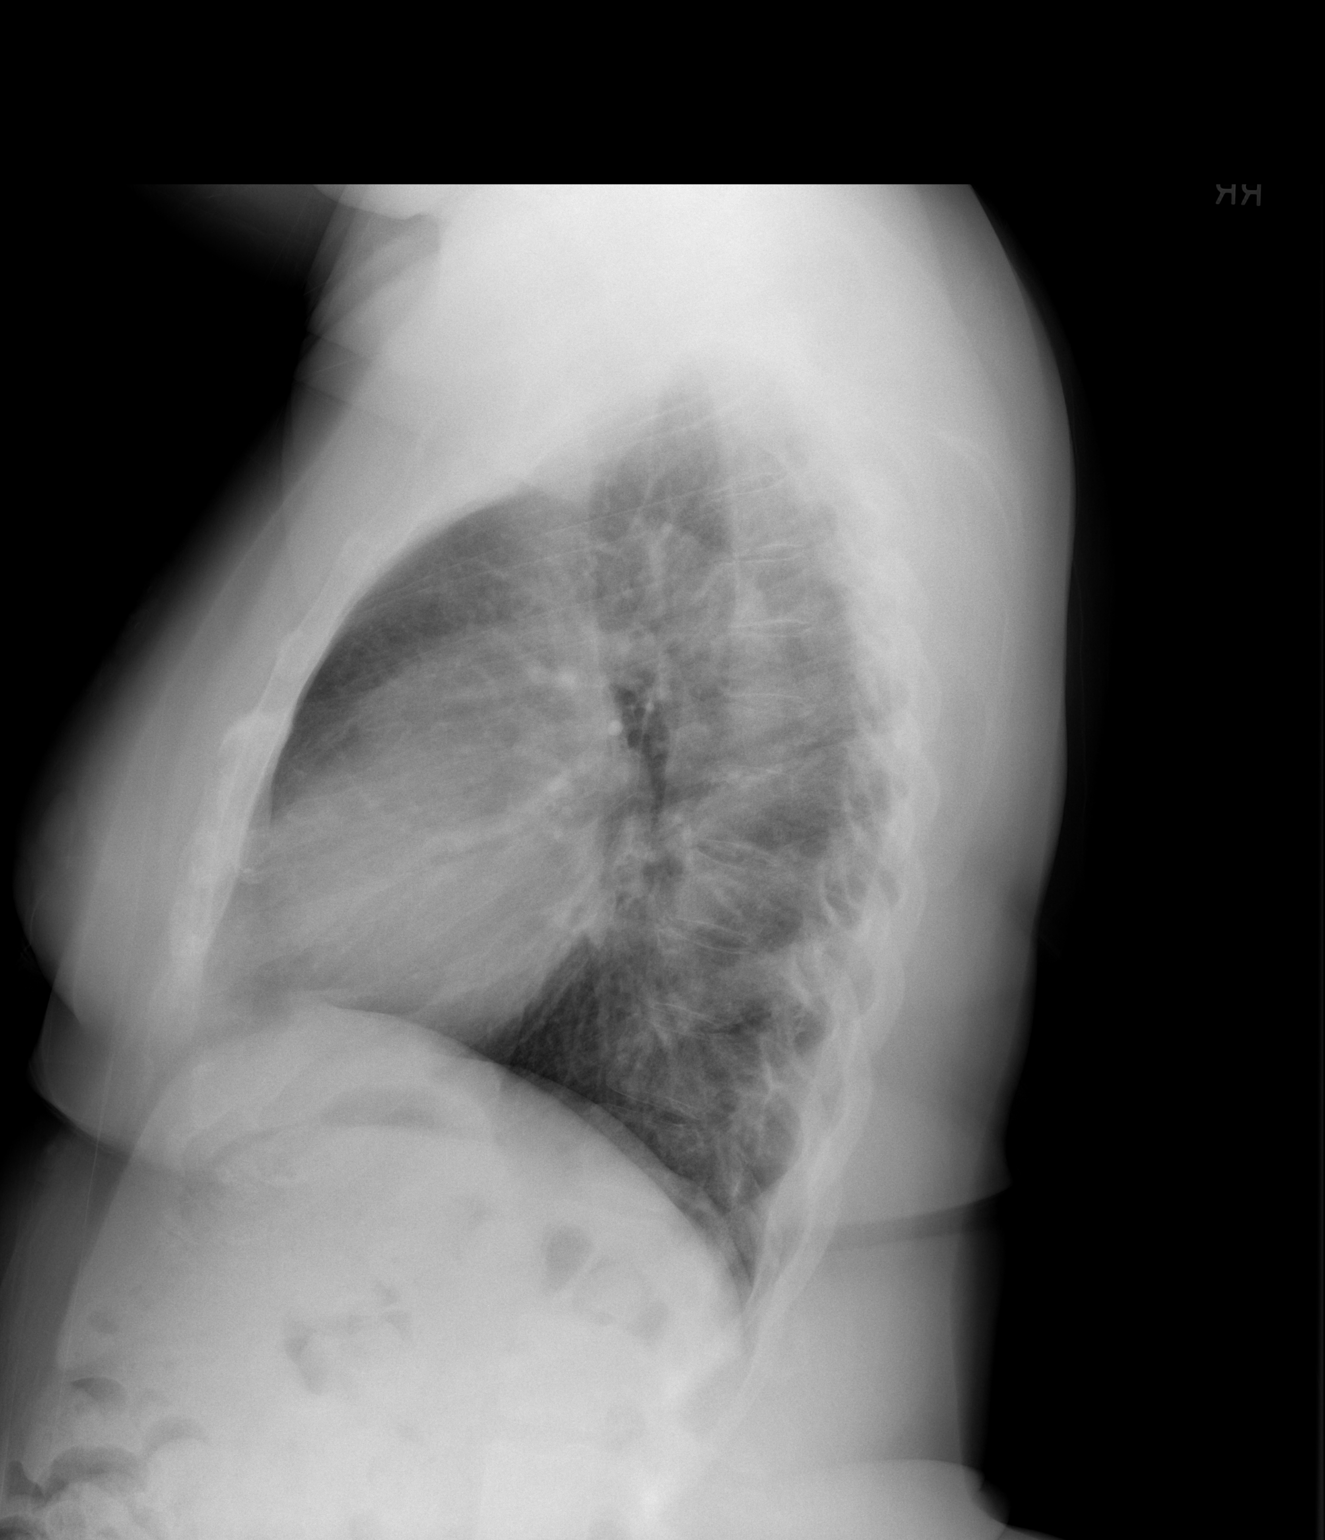

[2 of 2 positions shown; findings below may reference images not displayed]

FINDINGS: The heart size and mediastinal contours are within normal limits.
Both lungs are clear. The visualized skeletal structures are
unremarkable.
IMPRESSION: No active cardiopulmonary disease.

## 2023-11-05 ENCOUNTER — Other Ambulatory Visit: Payer: Self-pay

## 2023-11-05 ENCOUNTER — Emergency Department (HOSPITAL_BASED_OUTPATIENT_CLINIC_OR_DEPARTMENT_OTHER): Payer: BC Managed Care – PPO

## 2023-11-05 ENCOUNTER — Emergency Department (HOSPITAL_BASED_OUTPATIENT_CLINIC_OR_DEPARTMENT_OTHER)
Admission: EM | Admit: 2023-11-05 | Discharge: 2023-11-05 | Disposition: A | Discharge status: Home / Self Care | Payer: BC Managed Care – PPO | Attending: Emergency Medicine | Admitting: Emergency Medicine

## 2023-11-05 ENCOUNTER — Encounter (HOSPITAL_BASED_OUTPATIENT_CLINIC_OR_DEPARTMENT_OTHER): Payer: Self-pay

## 2023-11-05 DIAGNOSIS — G43809 Other migraine, not intractable, without status migrainosus: Secondary | ICD-10-CM | POA: Diagnosis not present

## 2023-11-05 DIAGNOSIS — G43109 Migraine with aura, not intractable, without status migrainosus: Secondary | ICD-10-CM

## 2023-11-05 DIAGNOSIS — R519 Headache, unspecified: Secondary | ICD-10-CM | POA: Diagnosis present

## 2023-11-05 LAB — CBC WITH DIFFERENTIAL/PLATELET
Abs Immature Granulocytes: 0.01 10*3/uL (ref 0.00–0.07)
Basophils Absolute: 0 10*3/uL (ref 0.0–0.1)
Basophils Relative: 1 %
Eosinophils Absolute: 0.2 10*3/uL (ref 0.0–0.5)
Eosinophils Relative: 3 %
HCT: 40.7 % (ref 36.0–46.0)
Hemoglobin: 12.9 g/dL (ref 12.0–15.0)
Immature Granulocytes: 0 %
Lymphocytes Relative: 36 %
Lymphs Abs: 2.2 10*3/uL (ref 0.7–4.0)
MCH: 28 pg (ref 26.0–34.0)
MCHC: 31.7 g/dL (ref 30.0–36.0)
MCV: 88.5 fL (ref 80.0–100.0)
Monocytes Absolute: 0.3 10*3/uL (ref 0.1–1.0)
Monocytes Relative: 5 %
Neutro Abs: 3.5 10*3/uL (ref 1.7–7.7)
Neutrophils Relative %: 55 %
Platelets: 265 10*3/uL (ref 150–400)
RBC: 4.6 MIL/uL (ref 3.87–5.11)
RDW: 12.2 % (ref 11.5–15.5)
WBC: 6.2 10*3/uL (ref 4.0–10.5)
nRBC: 0 % (ref 0.0–0.2)

## 2023-11-05 LAB — COMPREHENSIVE METABOLIC PANEL
ALT: 16 U/L (ref 0–44)
AST: 18 U/L (ref 15–41)
Albumin: 3.6 g/dL (ref 3.5–5.0)
Alkaline Phosphatase: 94 U/L (ref 38–126)
Anion gap: 9 (ref 5–15)
BUN: 14 mg/dL (ref 6–20)
CO2: 23 mmol/L (ref 22–32)
Calcium: 8.6 mg/dL — ABNORMAL LOW (ref 8.9–10.3)
Chloride: 105 mmol/L (ref 98–111)
Creatinine, Ser: 0.75 mg/dL (ref 0.44–1.00)
GFR, Estimated: >60 mL/min (ref >60)
Glucose, Bld: 134 mg/dL — ABNORMAL HIGH (ref 70–99)
Potassium: 3.6 mmol/L (ref 3.5–5.1)
Sodium: 137 mmol/L (ref 135–145)
Total Bilirubin: 0.5 mg/dL (ref <1.2)
Total Protein: 7.1 g/dL (ref 6.5–8.1)

## 2023-11-05 LAB — CBG MONITORING, ED: Glucose-Capillary: 127 mg/dL — ABNORMAL HIGH (ref 70–99)

## 2023-11-05 MED ORDER — DIPHENHYDRAMINE HCL 50 MG/ML IJ SOLN
25.0000 mg | Freq: Once | INTRAMUSCULAR | Status: DC
  Filled 2023-11-05: qty 1

## 2023-11-05 MED ORDER — METOCLOPRAMIDE HCL 5 MG/ML IJ SOLN
10.0000 mg | Freq: Once | INTRAMUSCULAR | Status: DC
  Filled 2023-11-05: qty 2

## 2023-11-05 NOTE — ED Provider Notes (Signed)
[place] [facility] Provider Note   CSN: [id number] Arrival date & time: 11/05/23  2014     History  Chief Complaint  Patient presents with   Headache   Altered Mental Status   Tingling    Rashawna Canez Cantrell is a 57 y.o. female here presenting with headache and altered mental status.  Patient states that she was driving and had sudden onset of right-sided headache around 7:30 PM.  She was on the phone with her friend at that time.  She states that she got confused and did not know where she was going.  She eventually was able to drive to her friend's home and brought her over for evaluation.  Patient states that she also had some tingling and numbness of the right side of her face.  She denies any slurred speech.  She denies any focal weakness.  She states that her mother has TIA in the past but she has no personal history of stroke  The history is provided by the patient.       Home Medications Prior to Admission medications   Medication Sig Start Date End Date Taking? Authorizing Provider  fluticasone (FLONASE) 50 MCG/ACT nasal spray Place 2 sprays into both nostrils daily. 03/18/21   Thomasene Lot, DO  metFORMIN (GLUCOPHAGE) 500 MG tablet Take 1 tablet (500 mg total) by mouth daily with breakfast. 06/04/21   Whitmire, Dawn, FNP  Vitamin D, Ergocalciferol, (DRISDOL) 1.25 MG (50000 UNIT) CAPS capsule Take 1 capsule (50,000 Units total) by mouth every 7 (seven) days. 06/04/21   Whitmire, Dawn, FNP      Allergies    Elemental sulfur and Sulfa antibiotics    Review of Systems   Review of Systems  Neurological:  Positive for headaches.  All other systems reviewed and are negative.   Physical Exam Updated Vital Signs BP 131/73 (BP Location: Left Arm)   Pulse 60   Temp 98 F (36.7 C) (Oral)   Resp 16   Ht 5\' 1"  (1.549 m)   Wt 91.8 kg   SpO2 100%   BMI 38.22 kg/m  Physical Exam Vitals and nursing note reviewed.  Constitutional:       Appearance: She is well-developed.  HENT:     Head: Normocephalic.     Mouth/Throat:     Mouth: Mucous membranes are moist.     Pharynx: Oropharynx is clear.  Eyes:     Extraocular Movements: Extraocular movements intact.     Pupils: Pupils are equal, round, and reactive to light.  Cardiovascular:     Rate and Rhythm: Normal rate and regular rhythm.     Heart sounds: Normal heart sounds.  Pulmonary:     Effort: Pulmonary effort is normal.     Breath sounds: Normal breath sounds.  Abdominal:     General: Bowel sounds are normal.  Musculoskeletal:        General: Normal range of motion.     Cervical back: Normal range of motion and neck supple.  Skin:    General: Skin is warm.  Neurological:     Mental Status: She is alert and oriented to person, place, and time.     Comments: No observed 2-12 is intact.  Patient has normal strength and sensation bilateral arms and legs.  Patient has negative Romberg sign.  Patient has normal finger-to-nose bilaterally.  Patient has normal gait  Psychiatric:        Mood and Affect: Mood normal.  ED Results / Procedures / Treatments   Labs (all labs ordered are listed, but only abnormal results are displayed) Labs Reviewed  COMPREHENSIVE METABOLIC PANEL - Abnormal; Notable for the following components:      Result Value   Glucose, Bld 134 (*)    Calcium 8.6 (*)    All other components within normal limits  CBG MONITORING, ED - Abnormal; Notable for the following components:   Glucose-Capillary 127 (*)    All other components within normal limits  CBC WITH DIFFERENTIAL/PLATELET    EKG None  Radiology CT Head Wo Contrast Result Date: 11/05/2023 CLINICAL DATA:  Numbness or tingling with paresthesias. Headache and confusion. Tingling to the right side of the face. EXAM: CT HEAD WITHOUT CONTRAST TECHNIQUE: Contiguous axial images were obtained from the base of the skull through the vertex without intravenous contrast. RADIATION DOSE  REDUCTION: This exam was performed according to the departmental dose-optimization program which includes automated exposure control, adjustment of the mA and/or kV according to patient size and/or use of iterative reconstruction technique. COMPARISON:  None Available. FINDINGS: Brain: No evidence of acute infarction, hemorrhage, hydrocephalus, extra-axial collection or mass lesion/mass effect. Vascular: No hyperdense vessel or unexpected calcification. Skull: Normal. Negative for fracture or focal lesion. Sinuses/Orbits: No acute finding. Other: None. IMPRESSION: No acute intracranial abnormalities. Electronically Signed   By: Burman Nieves M.D.   On: 11/05/2023 21:07    Procedures Procedures    Medications Ordered in ED Medications - No data to display  ED Course/ Medical Decision Making/ A&P                                 Medical Decision Making Grayci Kelemen Leinberger is a 57 y.o. female here presenting with headache and confusion.  Patient had acute onset of headache and confusion that resolved.  Patient has a nonfocal neuroexam right now.  Consider subarachnoid hemorrhage versus complicated migraine.  I do not think she has a TIA or stroke currently.  Plan to get CT head and labs.  9:20 PM CT head did not show subarachnoid hemorrhage.  Labs unremarkable.  Patient headache has resolved and normal neuroexam right now.  Will refer to neurology outpatient regarding her headaches.  She does not need MRI currently   Problems Addressed: Complicated migraine: acute illness or injury  Amount and/or Complexity of Data Reviewed Labs: ordered. Radiology: ordered.    Final Clinical Impression(s) / ED Diagnoses Final diagnoses:  None    Rx / DC Orders ED Discharge Orders     None         Charlynne Pander, MD 11/05/23 2120

## 2023-11-05 NOTE — Discharge Instructions (Addendum)
As we discussed your labs and CT head were unremarkable today.  You may have a complex migraine and I recommend you follow-up with a neurologist.  I have sent in a referral for a neurologist and you should get a call from them  Return to ER if you have worse headache or confusion or trouble speaking or focal weakness or numbness

## 2023-11-05 NOTE — ED Notes (Signed)
 Discharge paperwork reviewed entirely with patient, including follow up care. Pain was under control. No prescriptions were called in, but all questions were addressed.  Pt verbalized understanding as well as all parties involved. No questions or concerns voiced at the time of discharge. No acute distress noted.   Pt ambulated out to PVA without incident or assistance.  Pt advised they will notify their PCP immediately. and Pt advised they will seek followup care with a specialist and followup with their PCP.

## 2023-11-05 NOTE — ED Triage Notes (Signed)
The patient started having a headache. Then she stated she felt confused. She got lost going to a place she goes to often. Then she stated that her right side of her face was tingling. She denied weakness to body. She stated its no better. This all started at 7:30.

## 2024-01-12 ENCOUNTER — Encounter: Payer: Self-pay | Admitting: Family Medicine

## 2024-01-12 DIAGNOSIS — Z1231 Encounter for screening mammogram for malignant neoplasm of breast: Secondary | ICD-10-CM

## 2024-07-28 ENCOUNTER — Encounter (HOSPITAL_BASED_OUTPATIENT_CLINIC_OR_DEPARTMENT_OTHER): Payer: Self-pay | Admitting: Emergency Medicine

## 2024-07-28 ENCOUNTER — Emergency Department (HOSPITAL_BASED_OUTPATIENT_CLINIC_OR_DEPARTMENT_OTHER)

## 2024-07-28 ENCOUNTER — Other Ambulatory Visit: Payer: Self-pay

## 2024-07-28 ENCOUNTER — Emergency Department (HOSPITAL_BASED_OUTPATIENT_CLINIC_OR_DEPARTMENT_OTHER)
Admission: EM | Admit: 2024-07-28 | Discharge: 2024-07-28 | Disposition: A | Discharge status: Home / Self Care | Attending: Emergency Medicine | Admitting: Emergency Medicine

## 2024-07-28 DIAGNOSIS — R091 Pleurisy: Secondary | ICD-10-CM | POA: Insufficient documentation

## 2024-07-28 DIAGNOSIS — R079 Chest pain, unspecified: Secondary | ICD-10-CM | POA: Diagnosis present

## 2024-07-28 LAB — HEPATIC FUNCTION PANEL
ALT: 18 U/L (ref 0–44)
AST: 20 U/L (ref 15–41)
Albumin: 4.1 g/dL (ref 3.5–5.0)
Alkaline Phosphatase: 99 U/L (ref 38–126)
Bilirubin, Direct: 0.2 mg/dL (ref 0.0–0.2)
Indirect Bilirubin: 0.3 mg/dL (ref 0.3–0.9)
Total Bilirubin: 0.5 mg/dL (ref 0.0–1.2)
Total Protein: 7 g/dL (ref 6.5–8.1)

## 2024-07-28 LAB — CBC
HCT: 39.1 % (ref 36.0–46.0)
Hemoglobin: 12.5 g/dL (ref 12.0–15.0)
MCH: 27.8 pg (ref 26.0–34.0)
MCHC: 32 g/dL (ref 30.0–36.0)
MCV: 87.1 fL (ref 80.0–100.0)
Platelets: 254 K/uL (ref 150–400)
RBC: 4.49 MIL/uL (ref 3.87–5.11)
RDW: 12.2 % (ref 11.5–15.5)
WBC: 5.5 K/uL (ref 4.0–10.5)
nRBC: 0 % (ref 0.0–0.2)

## 2024-07-28 LAB — D-DIMER, QUANTITATIVE: D-Dimer, Quant: 0.38 ug{FEU}/mL (ref 0.00–0.50)

## 2024-07-28 LAB — BASIC METABOLIC PANEL WITH GFR
Anion gap: 12 (ref 5–15)
BUN: 17 mg/dL (ref 6–20)
CO2: 23 mmol/L (ref 22–32)
Calcium: 8.7 mg/dL — ABNORMAL LOW (ref 8.9–10.3)
Chloride: 104 mmol/L (ref 98–111)
Creatinine, Ser: 0.84 mg/dL (ref 0.44–1.00)
GFR, Estimated: >60 mL/min (ref >60)
Glucose, Bld: 107 mg/dL — ABNORMAL HIGH (ref 70–99)
Potassium: 4.1 mmol/L (ref 3.5–5.1)
Sodium: 139 mmol/L (ref 135–145)

## 2024-07-28 LAB — TROPONIN T, HIGH SENSITIVITY: Troponin T High Sensitivity: <15 ng/L (ref 0–19)

## 2024-07-28 LAB — LIPASE, BLOOD: Lipase: 29 U/L (ref 11–51)

## 2024-07-28 MED ORDER — KETOROLAC TROMETHAMINE 15 MG/ML IJ SOLN
15.0000 mg | Freq: Once | INTRAMUSCULAR | Status: AC
  Administered 2024-07-28: 15 mg via INTRAVENOUS
  Filled 2024-07-28: qty 1

## 2024-07-28 NOTE — ED Triage Notes (Signed)
 Pt states chest pain woke her up out of sleep. States Hx of heart burn but this feels different and wraps around back like a band.

## 2024-07-28 NOTE — Discharge Instructions (Signed)

## 2024-07-28 NOTE — ED Provider Notes (Signed)
 [place] [facility] Provider Note   CSN: [id number] Arrival date & time: 07/28/24  0454     Patient presents with: Chest Pain   Sandra Thornton is a 58 y.o. female.   The history is provided by the patient and the spouse.  Patient w/history of hyperlipidemia, distant history of DVT presents with chest and back pain Patient reports earlier in the evening she had some symptoms of reflux. She went to bed, and woke up with pain in her chest and back.  It is tight but it seems to worsen with breathing. She reports the pain has been constant There has been no tearing or ripping sensation noted into her back Does not worse with position, no recent heavy lifting or falls or trauma No shortness of breath.  No fevers or vomiting, no diaphoresis Denies any arm or leg weakness/numbness   Past Medical History:  Diagnosis Date   Back pain    Chest pain    Constipation    DVT (deep venous thrombosis) (HCC)    Food allergy    Graves disease    Heartburn    High cholesterol    Hyperthyroidism    Joint pain    SOBOE (shortness of breath on exertion)    Swelling of both lower extremities    Vitamin B12 deficiency    Vitamin D  deficiency     Prior to Admission medications   Medication Sig Start Date End Date Taking? Authorizing Provider  atorvastatin (LIPITOR) 10 MG tablet Take 10 mg by mouth daily. 07/05/24  Yes [provider]  fluticasone  (FLONASE ) 50 MCG/ACT nasal spray Place 2 sprays into both nostrils daily. 03/18/21   Midge Sober, DO  metFORMIN  (GLUCOPHAGE ) 500 MG tablet Take 1 tablet (500 mg total) by mouth daily with breakfast. 06/04/21   Whitmire, Dawn, FNP  Vitamin D , Ergocalciferol , (DRISDOL ) 1.25 MG (50000 UNIT) CAPS capsule Take 1 capsule (50,000 Units total) by mouth every 7 (seven) days. 06/04/21   Whitmire, Dawn, FNP    Allergies: Elemental sulfur and Sulfa antibiotics    Review of Systems  Constitutional:  Negative  for diaphoresis and fever.  Respiratory:  Negative for shortness of breath.   Cardiovascular:  Positive for chest pain.  Gastrointestinal:  Negative for abdominal pain and vomiting.  Musculoskeletal:  Positive for back pain.  Neurological:  Negative for weakness and numbness.    Updated Vital Signs BP 106/77   Pulse 64   Temp 98.3 F (36.8 C) (Oral)   Resp 16   Ht 1.562 m (5' 1.5)   Wt 88.5 kg   SpO2 97%   BMI 36.25 kg/m   Physical Exam CONSTITUTIONAL: Well developed/well nourished HEAD: Normocephalic/atraumatic EYES: EOMI/PERRL ENMT: Mucous membranes moist NECK: supple no meningeal signs SPINE/BACK:entire spine nontender CV: S1/S2 noted, no murmurs/rubs/gallops noted LUNGS: Lungs are clear to auscultation bilaterally, no apparent distress Pain in her back and chest appear to be reproduced by movement ABDOMEN: soft, nontender, no rebound or guarding, bowel sounds noted throughout abdomen NEURO: Pt is awake/alert/appropriate, moves all extremitiesx4.  No facial droop.   Equal handgrips noted She is able move all 4 extremities without difficulty EXTREMITIES: pulses normal/equalx4, full ROM, no calf tenderness SKIN: warm, color normal PSYCH: no abnormalities of mood noted, alert and oriented to situation  (all labs ordered are listed, but only abnormal results are displayed) Labs Reviewed  BASIC METABOLIC PANEL WITH GFR - Abnormal; Notable for the following components:  Result Value   Glucose, Bld 107 (*)    Calcium 8.7 (*)    All other components within normal limits  CBC  LIPASE, BLOOD  HEPATIC FUNCTION PANEL  D-DIMER, QUANTITATIVE  TROPONIN T, HIGH SENSITIVITY    EKG: EKG Interpretation Date/Time:  Friday July 28 2024 05:03:33 EDT Ventricular Rate:  67 PR Interval:  206 QRS Duration:  90 QT Interval:  421 QTC Calculation: 445 R Axis:   29  Text Interpretation: Sinus rhythm Borderline prolonged PR interval Low voltage, precordial leads No  significant change since last tracing Confirmed by Midge Golas ([id number]) on 07/28/2024 5:08:41 AM  Radiology: DG Chest 2 View Result Date: 07/28/2024 EXAM: 2 VIEW(S) XRAY OF THE CHEST 07/28/2024 05:55:42 AM COMPARISON: 08/24/2022 CLINICAL HISTORY: Chest pain. Pt reports pain wraps around back like a band. Hx back pain and chest pain. FINDINGS: LUNGS AND PLEURA: No focal pulmonary opacity. No pulmonary edema. No pleural effusion. No pneumothorax. HEART AND MEDIASTINUM: No acute abnormality of the cardiac and mediastinal silhouettes. BONES AND SOFT TISSUES: No acute osseous abnormality. IMPRESSION: 1. No acute process. Electronically signed by: Waddell Calk MD 07/28/2024 06:27 AM EDT RP Workstation: [id number]     Procedures   Medications Ordered in the ED  ketorolac  (TORADOL ) 15 MG/ML injection 15 mg (has no administration in time range)    Clinical Course as of 07/28/24 0641  Fri Jul 28, 2024  9360 Patient with distant history of DVT presents with chest and back pain. Has been ongoing for several hours and appears to be worsened with movement of her upper body  Initial workup was overall been unremarkable.  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 Radiology: ordered.  Risk Prescription drug management.   This patient presents to the ED for concern of chest pain, this involves an extensive number of treatment options, and is a complaint that carries with it a high risk of complications and morbidity.  The differential diagnosis includes but is not limited to acute coronary syndrome, aortic dissection, pulmonary embolism, pericarditis, pneumothorax, pneumonia, myocarditis, pleurisy, esophageal rupture   Comorbidities that complicate the patient evaluation: Patient's presentation is complicated by their history of hyperlipidemia  Social Determinants of Health: Patient's job stressors  increases the complexity of managing their presentation  Additional history obtained: Additional history obtained from spouse Records reviewed Primary Care Documents  Lab Tests: I Ordered, and personally interpreted labs.  The pertinent results include: Labs unremarkable  Imaging Studies ordered: I ordered imaging studies including X-ray chest  I independently visualized and interpreted imaging which showed no acute findings I agree with the radiologist interpretation  Cardiac Monitoring: The patient was maintained on a cardiac monitor.  I personally viewed and interpreted the cardiac monitor which showed an underlying rhythm of:  sinus rhythm  Test Considered: Patient is low risk / negative by heart score, therefore do not feel that cardiac admission is indicated.  Reevaluation: After the interventions noted above, I reevaluated the patient and found that they have :stayed the same  Complexity of problems addressed: Patient's presentation is most consistent with  acute presentation with potential threat to life or bodily function  Disposition: After consideration of the diagnostic results and the patient's response to treatment,  I feel that the patent would benefit from discharge  .        Final diagnoses:  Pleurisy     ED Discharge Orders     None          Midge Golas, MD 07/28/24 [phone]
